# Patient Record
Sex: Female | Born: 1985 | Race: White | Hispanic: No | Marital: Single | Smoking: Current every day smoker
Health system: Southern US, Community
[De-identification: ages and names within clinical notes are randomized; demographics above are authoritative.]

---

## 2008-12-13 ENCOUNTER — Emergency Department: Payer: Self-pay | Admitting: Emergency Medicine

## 2010-01-01 ENCOUNTER — Ambulatory Visit: Payer: Self-pay | Admitting: Family Medicine

## 2010-01-07 ENCOUNTER — Encounter: Payer: Self-pay | Admitting: Family Medicine

## 2010-01-09 ENCOUNTER — Encounter: Payer: Self-pay | Admitting: Family Medicine

## 2010-01-10 ENCOUNTER — Emergency Department: Payer: Self-pay | Admitting: Emergency Medicine

## 2010-02-08 ENCOUNTER — Encounter: Payer: Self-pay | Admitting: Family Medicine

## 2010-02-25 ENCOUNTER — Encounter: Payer: Self-pay | Admitting: Obstetrics & Gynecology

## 2010-03-28 ENCOUNTER — Encounter: Payer: Self-pay | Admitting: Obstetrics and Gynecology

## 2010-05-05 ENCOUNTER — Inpatient Hospital Stay: Payer: Self-pay

## 2011-10-24 IMAGING — US US OB US >=[ID] SNGL FETUS
1 series · 17 of 28 positions shown · non-contrast
Comparison: none

REASON FOR EXAM: unsure dates
COMMENTS:

[Series 1: us ob us >=(id) sngl fetus · 17 of 87 slices shown]
[im 1/87]
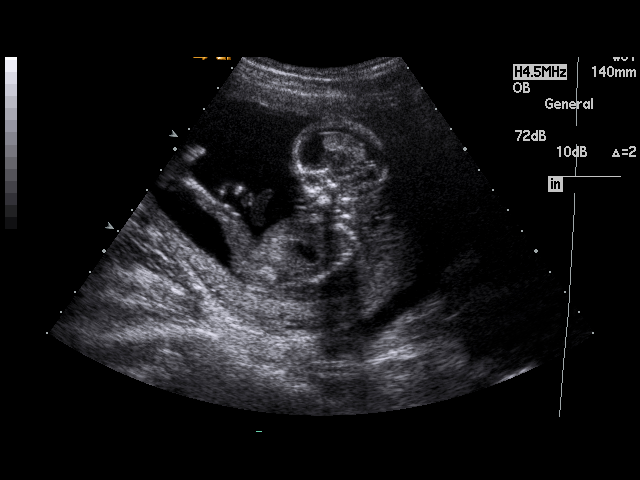
[im 7/87]
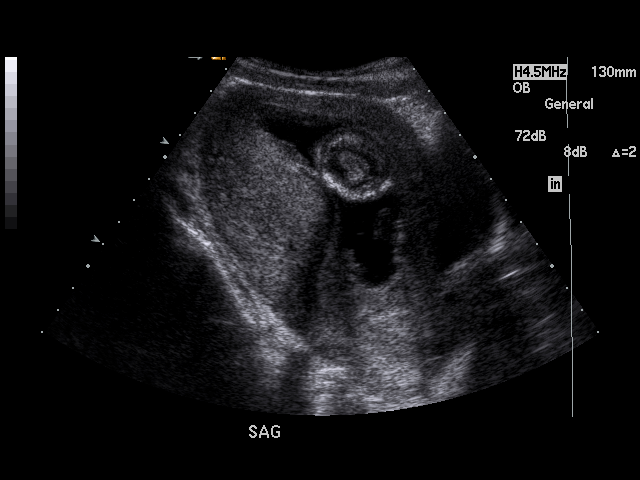
[im 13/87]
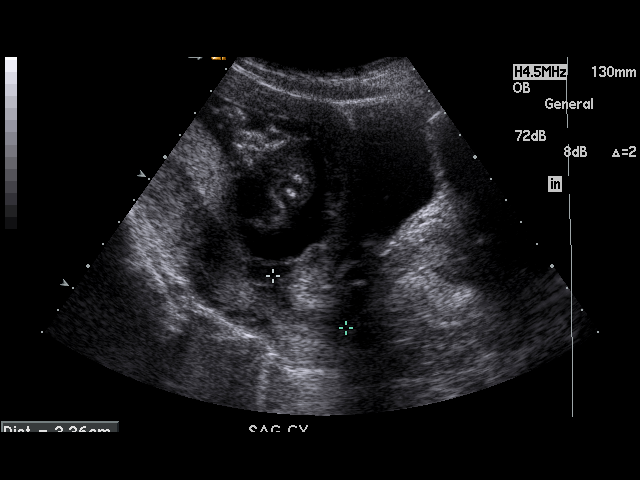
[im 16/87]
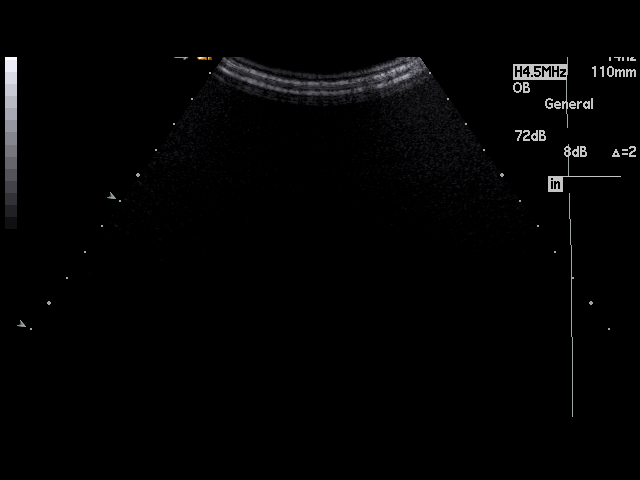
[im 23/87]
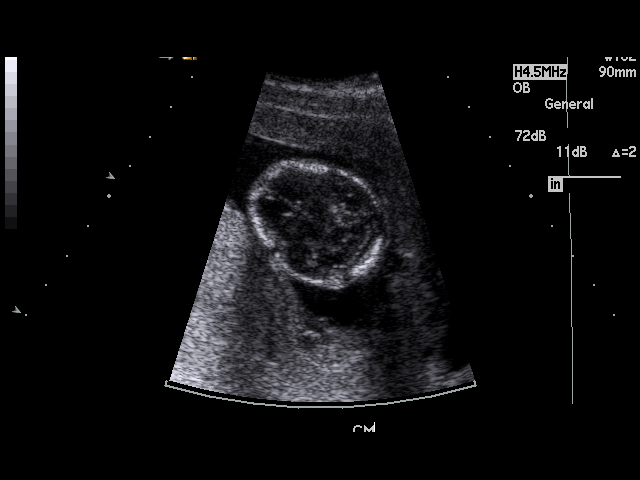
[im 29/87]
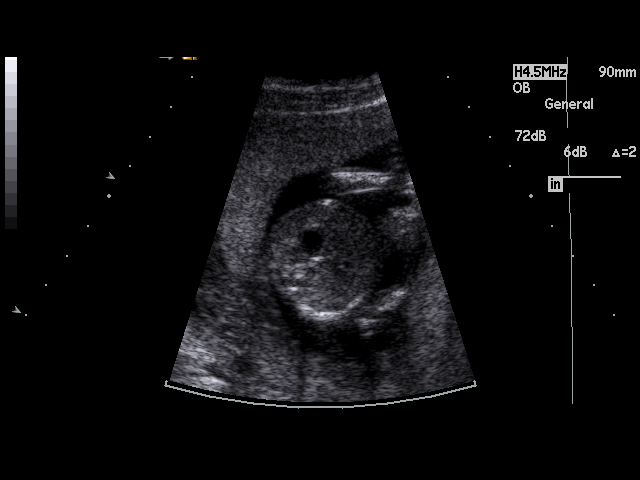
[im 32/87]
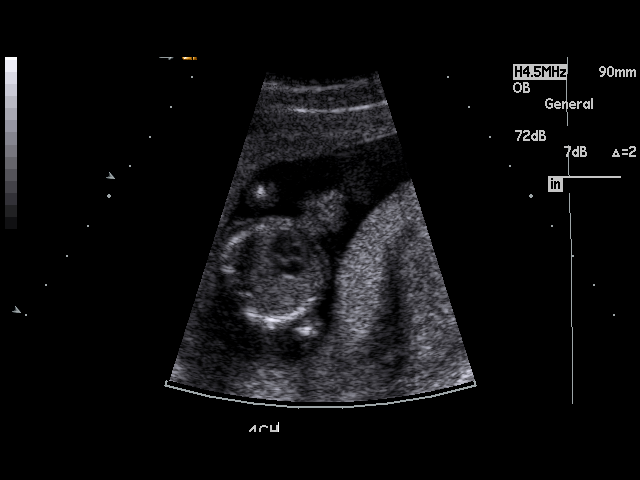
[im 39/87]
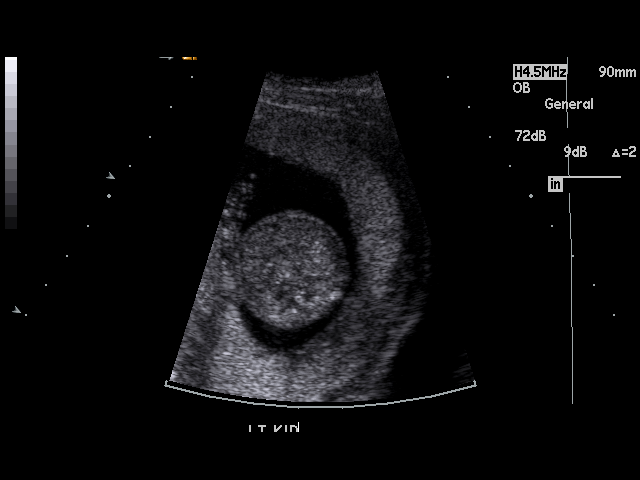
[im 45/87]
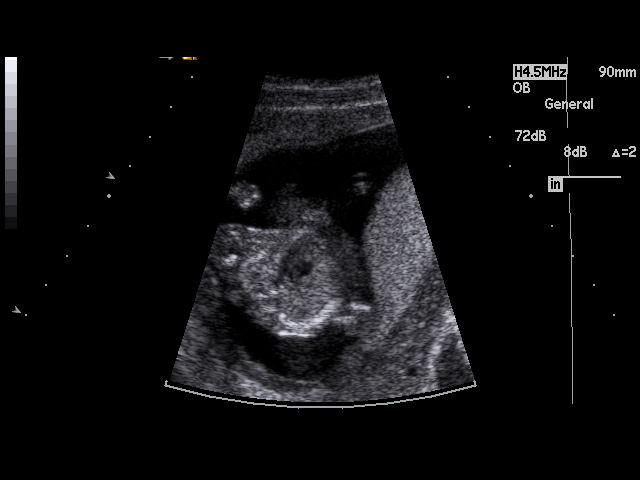
[im 48/87]
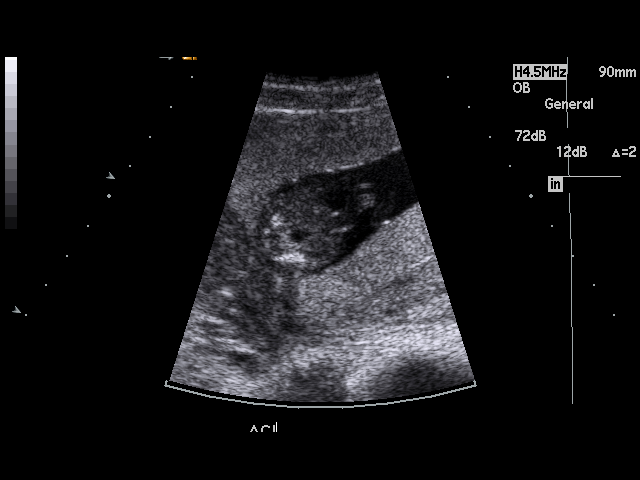
[im 55/87]
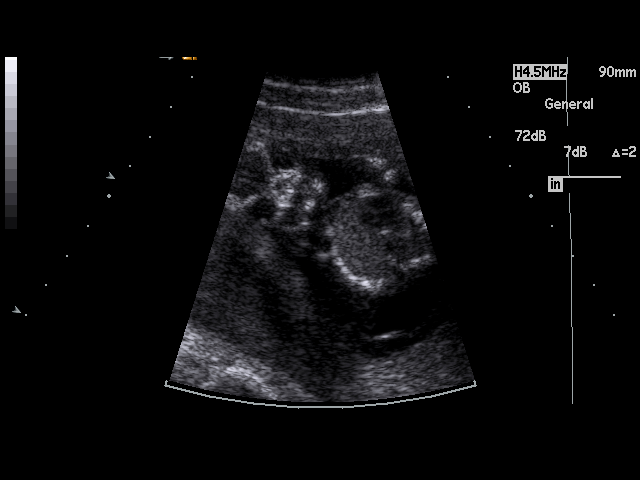
[im 58/87]
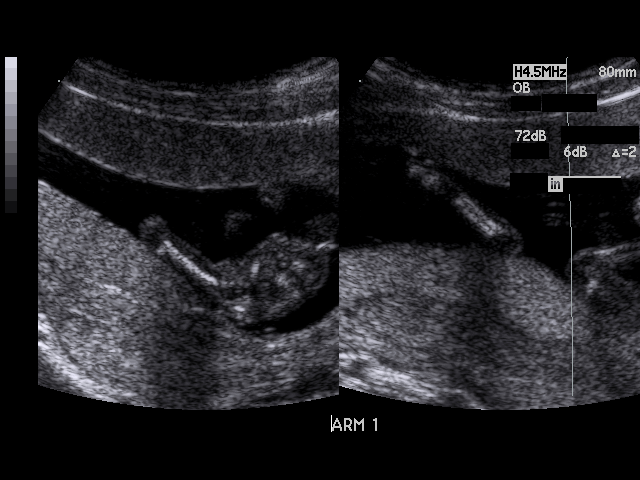
[im 64/87]
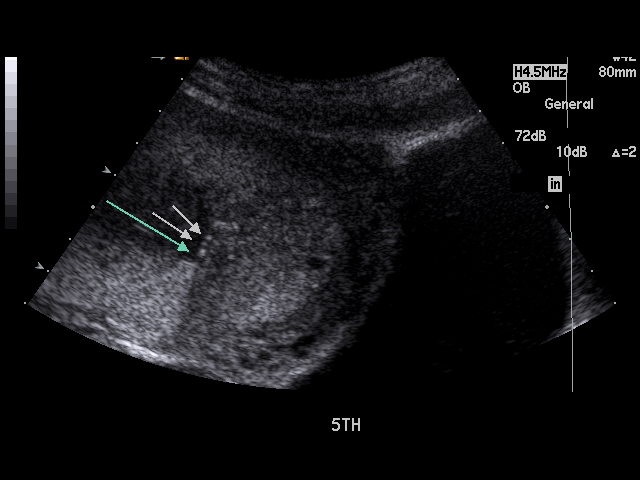
[im 71/87]
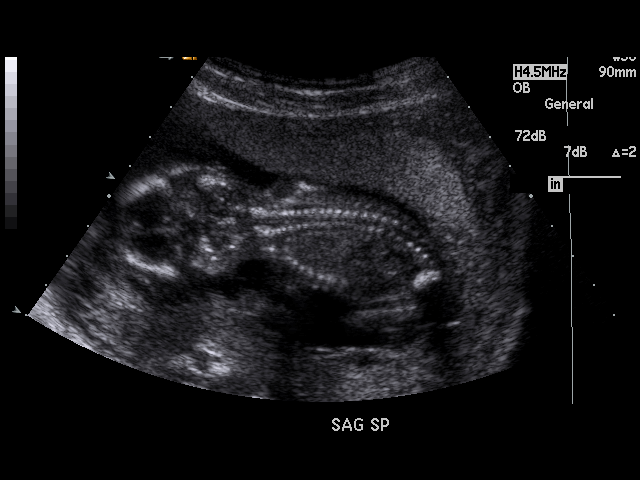
[im 74/87]
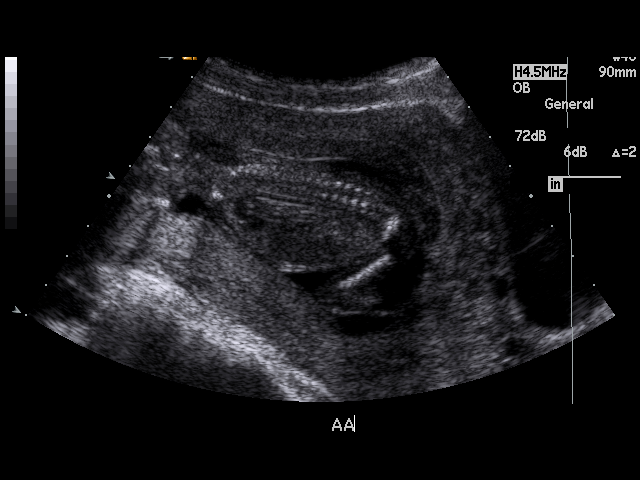
[im 80/87]
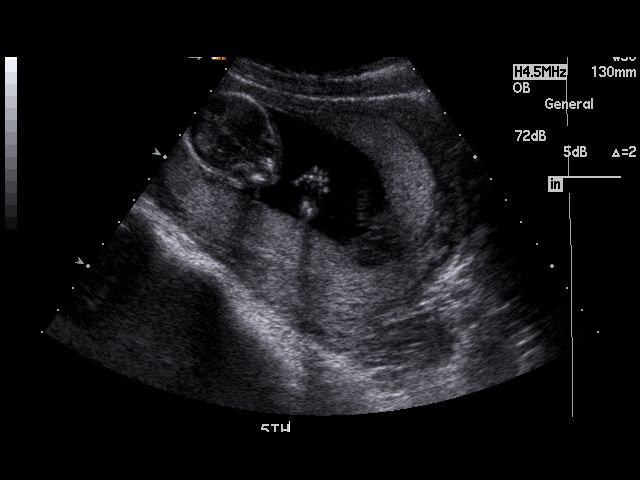
[im 87/87]
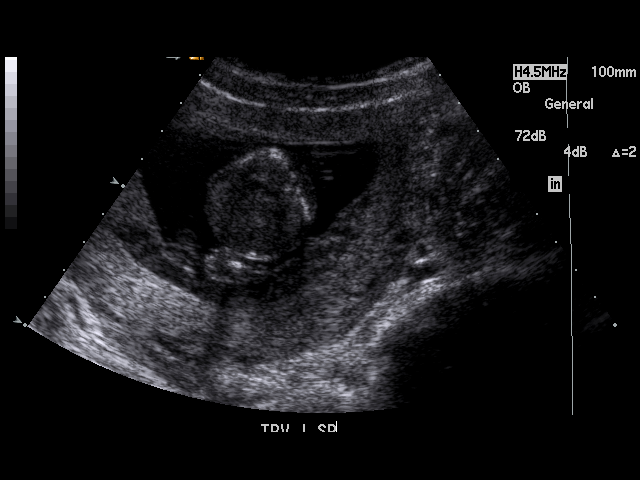

[17 of 28 positions shown; findings below may reference images not displayed]

PROCEDURE:     US  - US OB GREATER/OR EQUAL TO HWD0F  - January 01, 2010  [DATE]

RESULT:     There is a viable IUP. The presentation is variable. The
amniotic fluid volume is estimated to be normal. The placenta is posterior
and low lying with the inferior margin just over 2 cm from the internal os.
The intracranial structures and spinal structures are grossly normal for
this early gestational age. The fetal stomach, kidneys, urinary bladder, and
4 chambered heart were demonstrated. A cardiac rate of 141 beats per minute
was demonstrated.

Measured parameters:
BPD 3.04 cm corresponding to an EGA of 15 weeks 4 days
HC 11.73 cm corresponding to an EGA of 15 weeks 5 days
AC 10.24 cm corresponding to an EGA of 16 weeks 2 days
FL 1.75 cm corresponding to an EGA of 15 weeks one day
Estimated fetal weight 155 grams + / - 21 grams.
IMPRESSION: There is a viable IUP with variable presentation and
somewhat low lying placenta. The estimated gestational age is 15 weeks 5
days plus or minus approximately 10 days. The estimated date of confinement
is 20 June, 2010. No fetal anomalies were identified.

## 2012-01-18 IMAGING — US US OB FOLLOW-UP - NRPT [facility]
1 series · 14 of 28 positions shown · non-contrast
Comparison: none

[Series 1: us ob follow-up - nrpt [facility] · 14 of 59 slices shown]
[im 3/59]
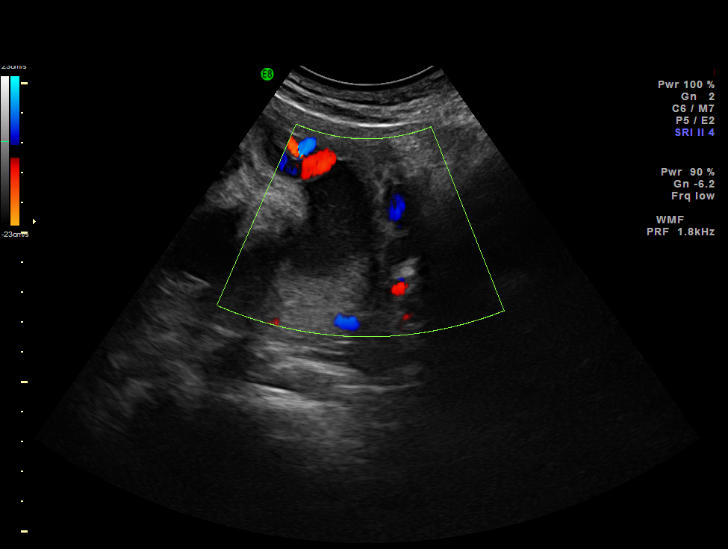
[im 7/59]
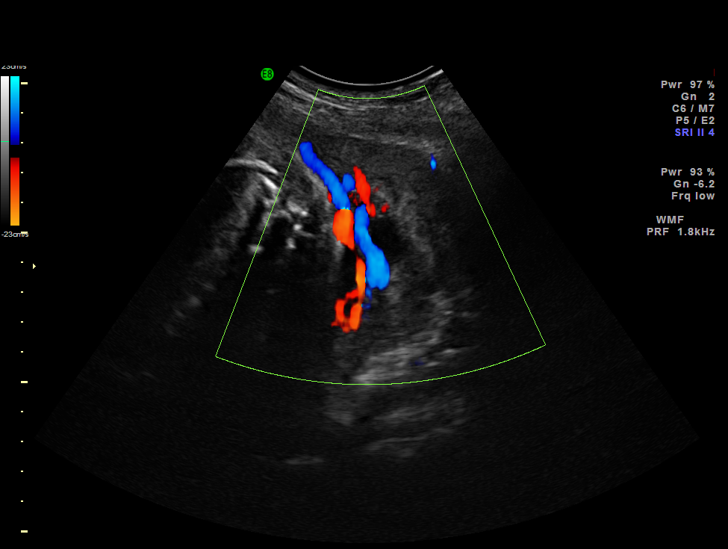
[im 11/59]
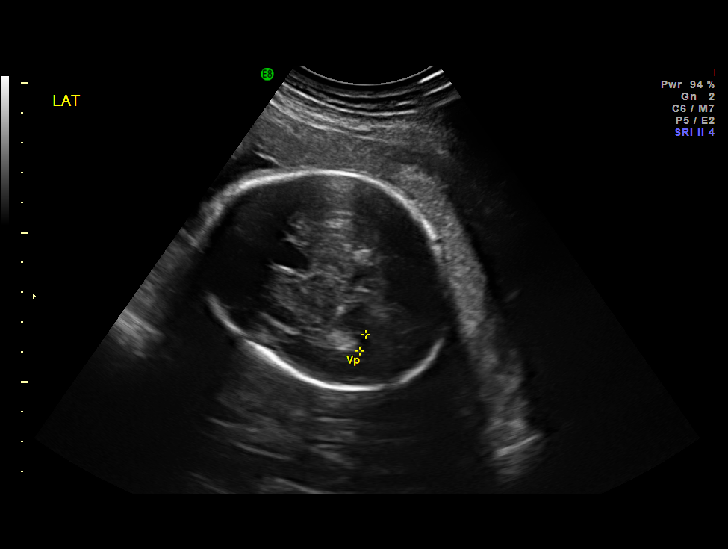
[im 16/59]
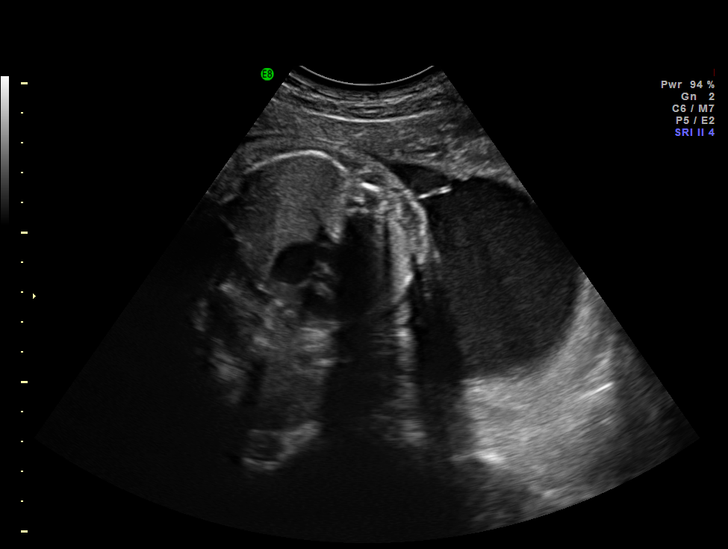
[im 20/59]
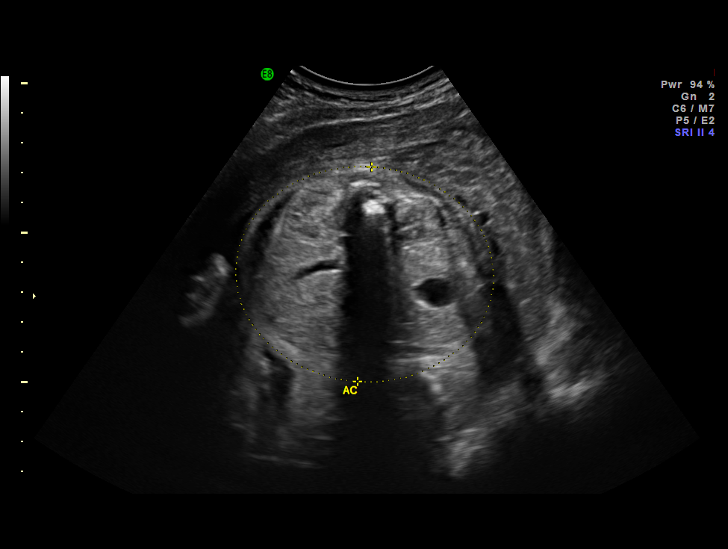
[im 24/59]
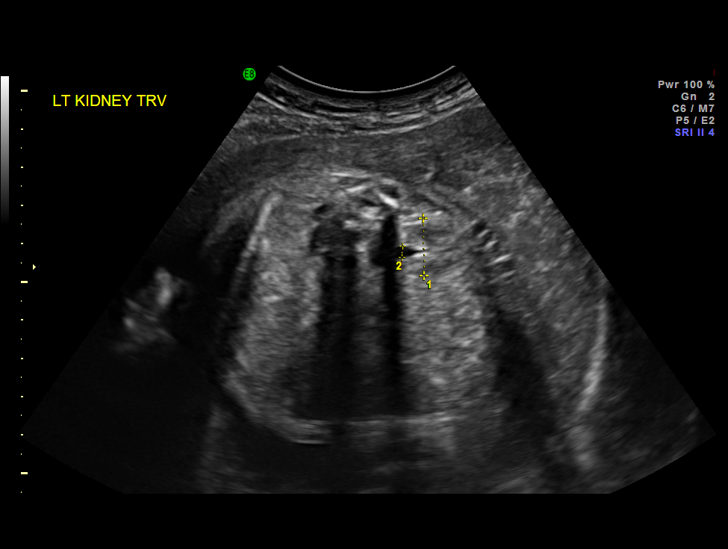
[im 28/59]
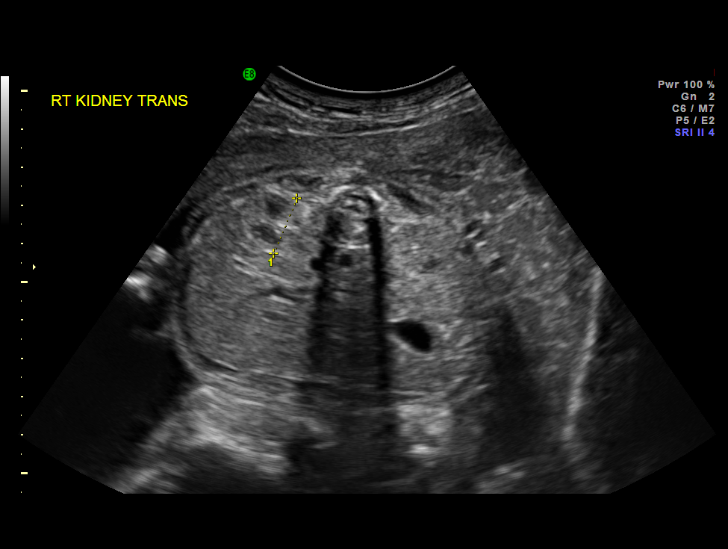
[im 33/59]
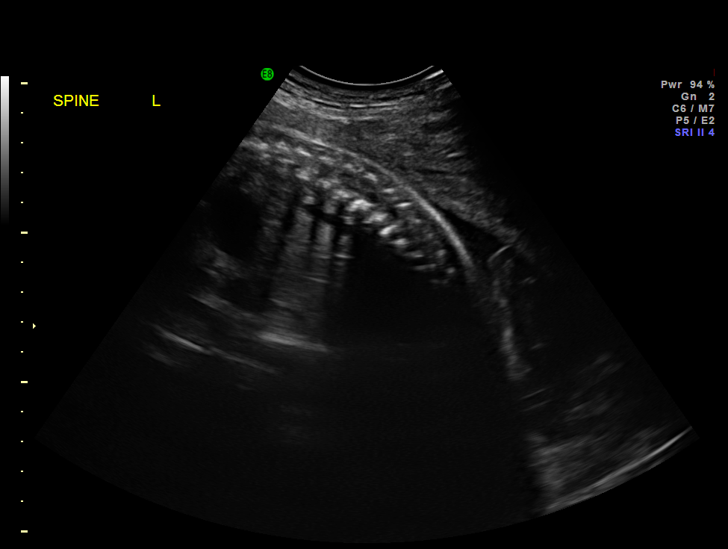
[im 37/59]
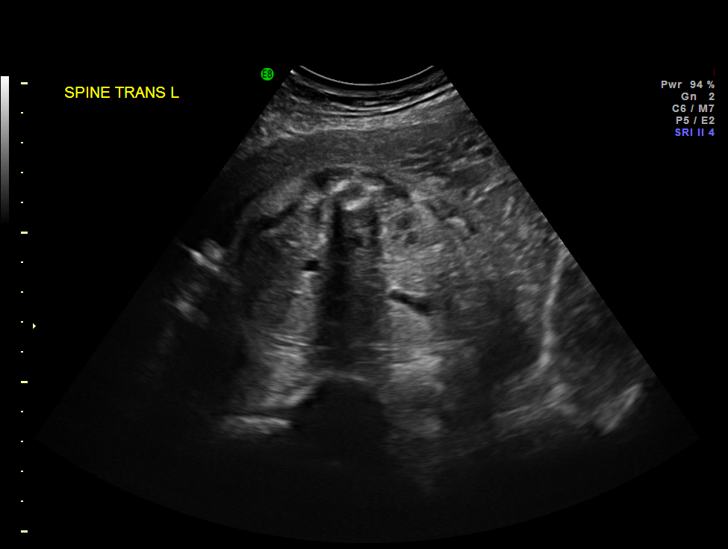
[im 41/59]
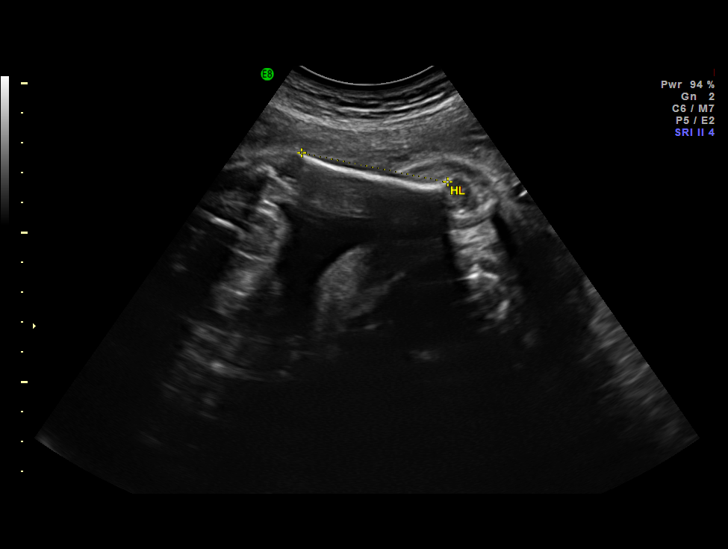
[im 46/59]
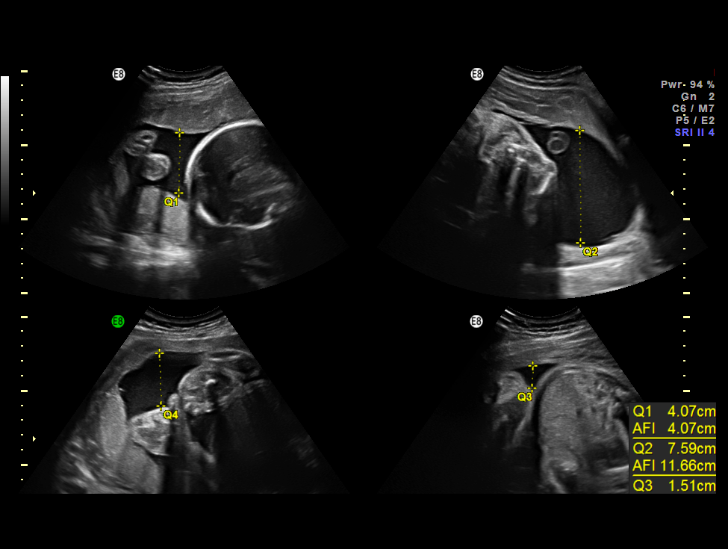
[im 50/59]
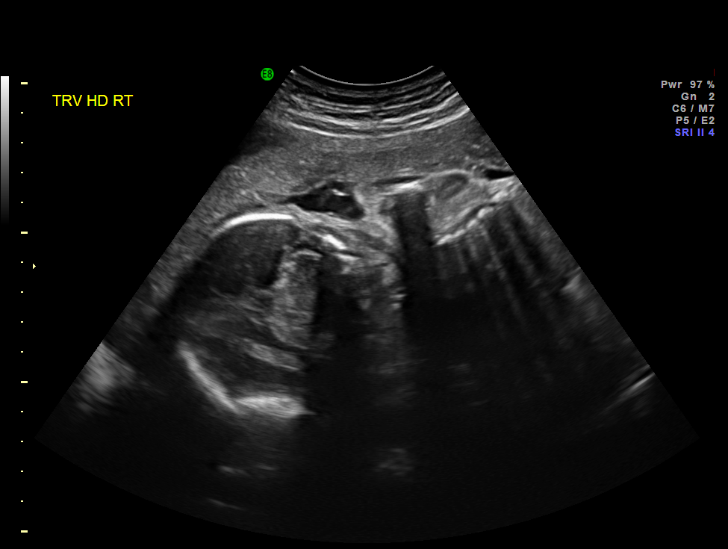
[im 54/59]
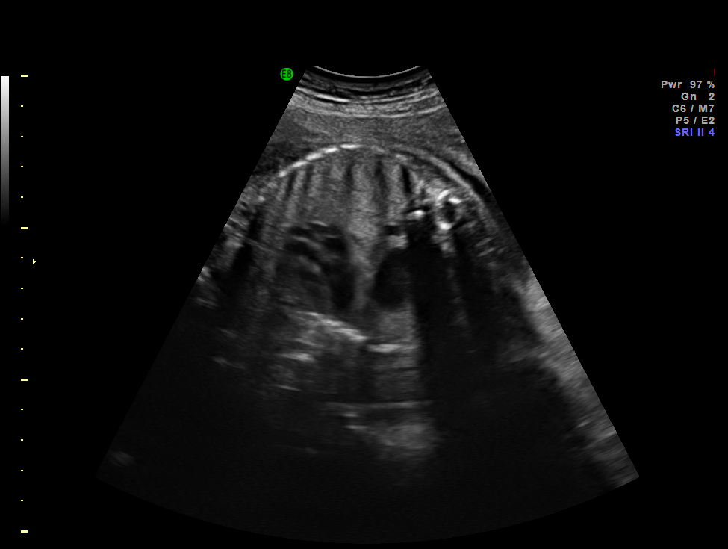
[im 59/59]
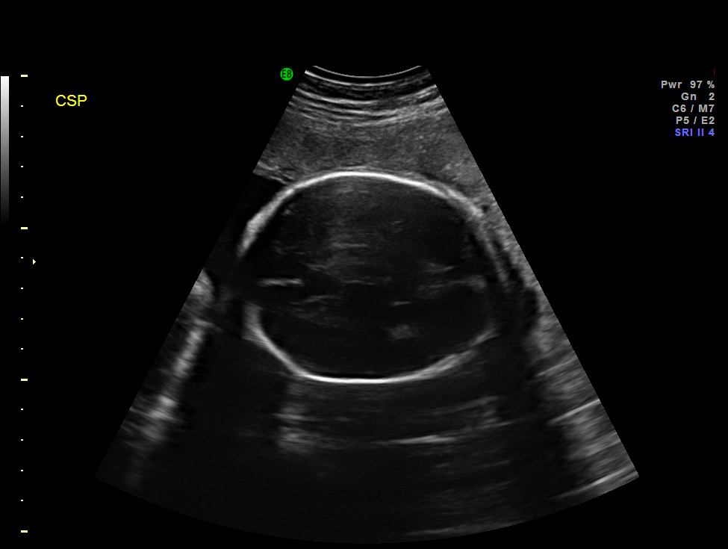

[14 of 28 positions shown; findings below may reference images not displayed]

IMAGES IMPORTED FROM THE [employer] WORKFLOW SYSTEM
NO DICTATION FOR STUDY

## 2012-12-07 ENCOUNTER — Emergency Department: Payer: Self-pay | Admitting: Emergency Medicine

## 2013-02-21 ENCOUNTER — Emergency Department: Payer: Self-pay | Admitting: Unknown Physician Specialty

## 2013-02-21 LAB — URINALYSIS, COMPLETE
Bilirubin,UR: NEGATIVE
Ketone: NEGATIVE
Nitrite: NEGATIVE
Ph: 5 (ref 4.5–8.0)
Specific Gravity: 1.024 (ref 1.003–1.030)

## 2013-02-21 LAB — CBC
HCT: 38.2 % (ref 35.0–47.0)
MCHC: 34.4 g/dL (ref 32.0–36.0)
MCV: 93 fL (ref 80–100)
Platelet: 232 10*3/uL (ref 150–440)
RDW: 13.4 % (ref 11.5–14.5)

## 2013-07-10 ENCOUNTER — Inpatient Hospital Stay: Payer: Self-pay | Admitting: Obstetrics and Gynecology

## 2013-07-10 DIAGNOSIS — O321XX0 Maternal care for breech presentation, not applicable or unspecified: Secondary | ICD-10-CM

## 2013-07-10 LAB — CBC WITH DIFFERENTIAL/PLATELET
Basophil #: 0.1 10*3/uL (ref 0.0–0.1)
Eosinophil #: 0.2 10*3/uL (ref 0.0–0.7)
Eosinophil %: 1.1 %
Lymphocyte %: 16 %
MCHC: 34.7 g/dL (ref 32.0–36.0)
MCV: 89 fL (ref 80–100)
Monocyte #: 0.9 x10 3/mm (ref 0.2–0.9)
Neutrophil #: 11.7 10*3/uL — ABNORMAL HIGH (ref 1.4–6.5)
Neutrophil %: 76.4 %
Platelet: 244 10*3/uL (ref 150–440)
RDW: 13.4 % (ref 11.5–14.5)
WBC: 15.3 10*3/uL — ABNORMAL HIGH (ref 3.6–11.0)

## 2014-08-24 ENCOUNTER — Ambulatory Visit: Payer: Self-pay | Admitting: Obstetrics & Gynecology

## 2014-10-05 ENCOUNTER — Inpatient Hospital Stay: Payer: Self-pay

## 2014-12-01 NOTE — Op Note (Signed)
PATIENT NAME:  [name] Odonnell, [name] Odonnell MR#:  [id number] DATE OF BIRTH:  18-Jan-1986  DATE OF PROCEDURE:  07/10/2013  PREOPERATIVE DIAGNOSES:  11.  A 29 year old, G2, P0-1-0-1 at 35 weeks, 6 days gestation.  2.  Preterm labor presenting at cervical dilation of 5 cm.  3.  Possible abruption with heavy vaginal bleeding at admission and nonreassuring fetal surveillance with decreased to absent variability noted on the fetal heart tones.  4.  Breech presentation.   POSTOPERATIVE DIAGNOSES: 31.  A 29 year old, G2, P0-1-0-1 at 35 weeks, 6 days gestation.  2.  Preterm labor presenting at cervical dilation of 5 cm.  3.  Possible abruption with heavy vaginal bleeding at admission and nonreassuring fetal surveillance with decreased to absent variability noted on the fetal heart tones.  4.  Breech presentation.   OPERATION PERFORMED: Primary low transverse cesarean section via Pfannenstiel skin incision.   ANESTHESIA USED: Spinal.   PRIMARY SURGEON: Lorrene Reid M.D.   ESTIMATED BLOOD LOSS: 600 mL.  OPERATIVE FLUIDS: 800 mL.   URINE OUTPUT: 150 mL of clear urine.  PREOPERATIVE ANTIBIOTICS: 1 gram of Ancef.   DRAINS OR TUBES: Foley to gravity drainage and On-Q catheter system.   IMPLANTS: None.   COMPLICATIONS: None.   INTRAOPERATIVE FINDINGS: Normal tubes, ovaries, and uterus. Delivery resulted in the birth of a live born female infant weighing 5 pounds 3 ounces, 2360 grams, Apgars 6 and 9.   SPECIMENS REMOVED: Placenta. Please note, cord gas was also sent, which returned with a pH of 7.31, CO2 of 57, bicarbonate 28.7 and base excess 1.2   PATIENT CONDITION FOLLOWING THE PROCEDURE: Stable.   PROCEDURE IN DETAIL: Risks, benefits, and alternatives of the procedure, in light of the physical exam and fetal heart tone findings, were discussed with the patient prior to proceeding to the case. The patient was taken to the operating room where she was placed under spinal anesthesia. She was prepped and  draped in the usual sterile fashion and the level of anesthetic was checked prior to proceeding with the case. A Pfannenstiel skin incision was made 2 cm above the pubic symphysis, carried down sharply to the level of the rectus fascia using a scalpel. The fascia was incised in the midline using the scalpel then extended using Mayo scissors. The superior border of the rectus fascia was grasped with 2 Kocher clamps. The underlying rectus muscles were dissected off the fascia using blunt dissection and the median raphe was incised using Mayo scissors. The inferior border of the rectus fascia was dissected off the rectus muscles in a similar fashion. The midline was identified. The peritoneum was entered bluntly. The peritoneal incision was extended using manual traction. A bladder blade was placed. A low transverse hysterotomy incision was then made by scoring the uterus with the scalpel. The uterus was entered bluntly using the operator's finger and the hysterotomy incision extended using manual traction. Upon placing the operator's hand into the hysterotomy incision, the fetus was noted to be in the frank breech position. The feet were grasped, brought to the incision, the infant was then delivered to the level of the scapula. The right arm was splinted and delivered across the patient's chest. The infant was then rotated 180 degrees and the left arm was splinted and delivered in a similar fashion. The fetal head was then flexed using a mauriceau smellie white maneuver and the head delivered using fundal pressure.   The cord was cut and the infant was passed to the awaiting pediatricians.  Cord gas and cord blood were obtained. The placenta was delivered using manual extraction. The uterus was exteriorized and wiped clean of clots and debris and closed using a 2 layer closure of 0 Vicryl with the first being a running locked, the second a horizontal imbricating. The uterus was returned to the abdomen, inspected and  noted to be hemostatic except for a small area on the left aspect of the hysterotomy, which was oversewn with single figure-of-eight, which achieved adequate hemostasis. The peritoneal gutters were wiped clean of clots and debris using 2 moist laps. The rectus muscles were reapproximated in the midline using a 2-0 Vicryl mattress stitch. The On-Q catheters were then placed subfascially per the usual protocol before closing the fascia using a single looped #1 PDS in a running fashion. The subcutaneous tissue was irrigated and hemostasis achieved using the Bovie. The skin was closed using a 4-0 Monocryl in subcuticular fashion. Each On-Q catheter was then bolused with 5 mL of 0.5% bupivacaine each. Sponge, needle, and instrument counts were correct x 2. The patient tolerated the procedure well and was taken to the recovery room in stable condition.   ____________________________ Florina OuAndreas M. Bonney AidStaebler, MD ams:aw D: 07/11/2013 08:54:42 ET T: 07/11/2013 09:17:07 ET JOB#: [id number]  cc: Florina OuAndreas M. Bonney AidStaebler, MD, <Dictator> Carmel SacramentoANDREAS Cathrine MusterM Sarit Sparano MD ELECTRONICALLY SIGNED 07/31/2013 19:01

## 2014-12-10 NOTE — Op Note (Signed)
PATIENT NAME:  [name] Odonnell, [name] Odonnell MR#:  [id number] DATE OF BIRTH:  Jun 14, 1986  DATE OF PROCEDURE:  10/05/2014  PREOPERATIVE DIAGNOSES: 5.  A 29 year old G3, P0-2-0-2 at 37 weeks 3 days presenting in term labor with cervical dilation of 5 cm.  2.  History of prior low transverse Cesarean section, desiring repeat.   POSTOPERATIVE DIAGNOSES:  89.  A 29 year old G3, P0-2-0-2 at 37 weeks 3 days presenting in term labor with cervical dilation of 5 cm.  2.  History of prior low transverse Cesarean section, desiring repeat.   PROCEDURE PERFORMED: Repeat low transverse cesarean section via Pfannenstiel skin incision.   ANESTHESIA USED: Spinal.   PRIMARY SURGEON: Vena Austria, MD   ESTIMATED BLOOD LOSS: 600 mL.  OPERATIVE FLUIDS: 500 mL.   URINE OUTPUT: 40 mL.   COMPLICATIONS: None.   INTRAOPERATIVE FINDINGS: Normal anatomy. Minimal scarring of the rectus fascia was encountered. The abdomen was clear of adhesions. Delivery resulted in the birth of a liveborn female infant weighing 3140 grams (6 pounds, 15 ounces) with Apgars 8 and 9.   SPECIMENS REMOVED: None.   PATIENT CONDITION FOLLOWING PROCEDURE: Stable.   PROCEDURE IN DETAIL: Risks, benefits, and alternatives of the procedure were discussed with the patient prior to proceeding to the operating room. The patient was taken to the operating room where she was placed under spinal anesthesia. She was positioned in the supine position, prepped and draped in the usual sterile fashion. Timeout was performed. Attention was turned to the patient's abdomen. A Pfannenstiel incision was scored 2 cm above the pubic symphysis utilizing the patient's pre-existing scar. The incision was carried down sharply to the level of the rectus fascia. The fascia was incised in the midline and extended using Mayo scissors. The superior border of the rectus fascia was grasped with 2 Kocher clamps. The underlying rectus muscles were dissected off rectus fascia bluntly. The  inferior border of the rectus fascia was likewise grasped with 2 Kocher clamps and the rectus fascia was dissected off the muscle in a similar fashion. The median raphe was incised using Mayo scissors. The midline was identified, entered bluntly. The peritoneal incision was extended using manual traction. A bladder blade was placed. A low transverse incision was scored on the uterus. The hysterotomy was entered bluntly using the operator's finger. The infant was noted to be in the OA position. Amniotomy was performed, noting clear fluid. The vertex was grasped, flexed, brought to the incision, and delivered atraumatically using fundal pressure. The remainder of the body delivered with ease. The infant was suctioned, cord was clamped and cut, and the infant was passed to the awaiting pediatricians. The placenta was delivered using manual extraction. The uterus was exteriorized and wiped clean of clots and debris. The uterus was repaired using a 2 layer closure of 0 Vicryl, with the first being a running lock and the second one imbricating. The uterus was returned to the abdomen. Hysterotomy incision was reinspected and noted to be hemostatic. The peritoneal gutters were wiped clean of clots and debris. The rectus muscles were reapproximated in the midline using a 2-0 Vicryl mattress stitch. The On-Q catheters were placed subfascial with the usual protocol. The fascia was closed using a running looped PDS. The subcutaneous tissue was irrigated and hemostasis achieved using the Bovie. Skin was closed using staples. Sponge, needle and instrument counts were correct x2. The patient tolerated the procedure well and was taken to the recovery room in stable condition.  ____________________________ Florina Ou. Bonney Aid, MD ams:sb  D: 10/09/2014 07:57:13 ET T: 10/09/2014 08:12:34 ET JOB#: [id number]  cc: Florina OuAndreas M. Bonney AidStaebler, MD, <Dictator> Lorrene ReidANDREAS M Cheryll Keisler MD ELECTRONICALLY SIGNED 10/17/2014 21:02

## 2014-12-14 IMAGING — US US OB LIMITED
1 series · 14 of 28 positions shown · non-contrast
Comparison: none

REASON FOR EXAM: vaginal bleeding
COMMENTS:

PROCEDURE:     US  - US LIMITED OB  - February 21, 2013  [DATE]
RESULT:     Comparison: None.
TECHNIQUE: Multiple grayscale and color Doppler images were obtained of the
pelvis.

[Series 1: us ob limited · 0.26mm/px · 14 of 48 slices shown]
[im 2/48]
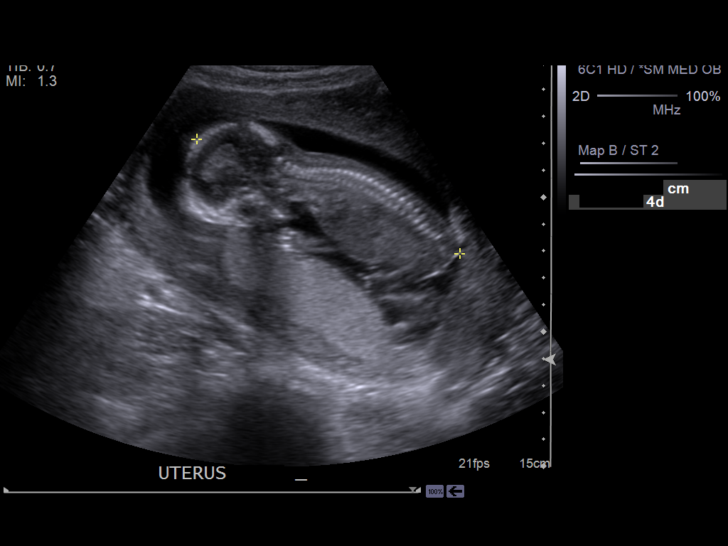
[im 6/48]
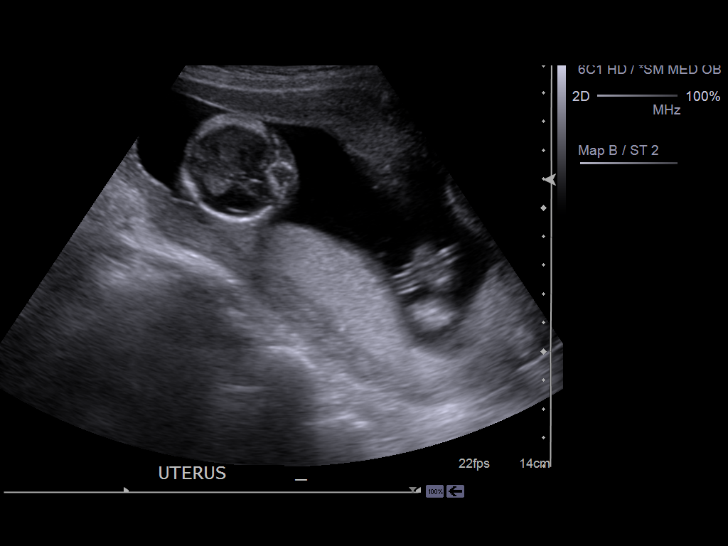
[im 9/48]
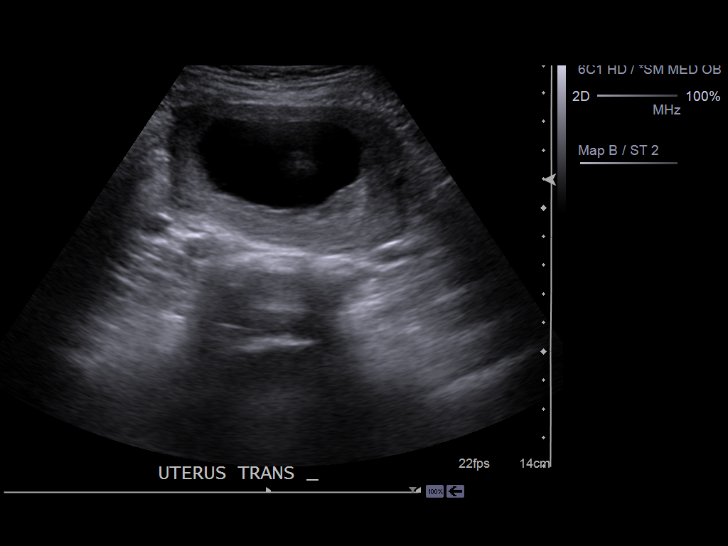
[im 13/48]
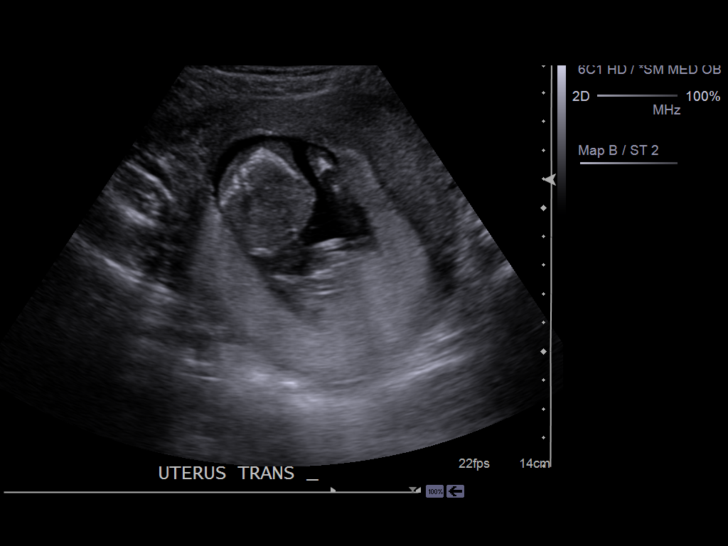
[im 16/48]
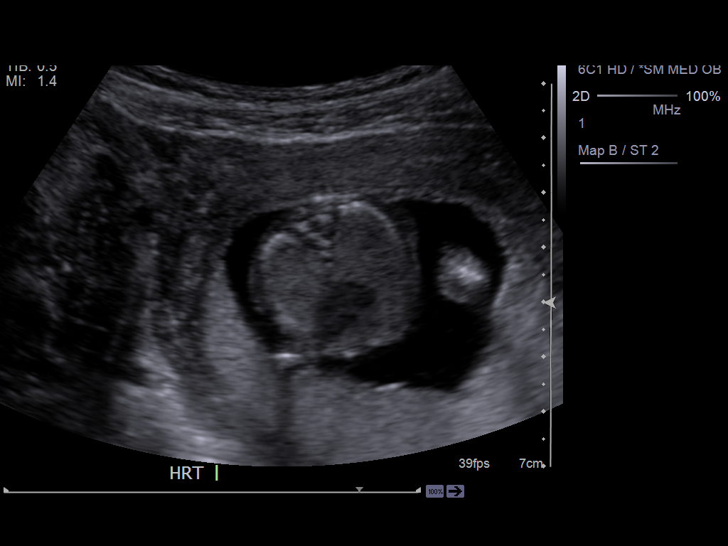
[im 20/48]
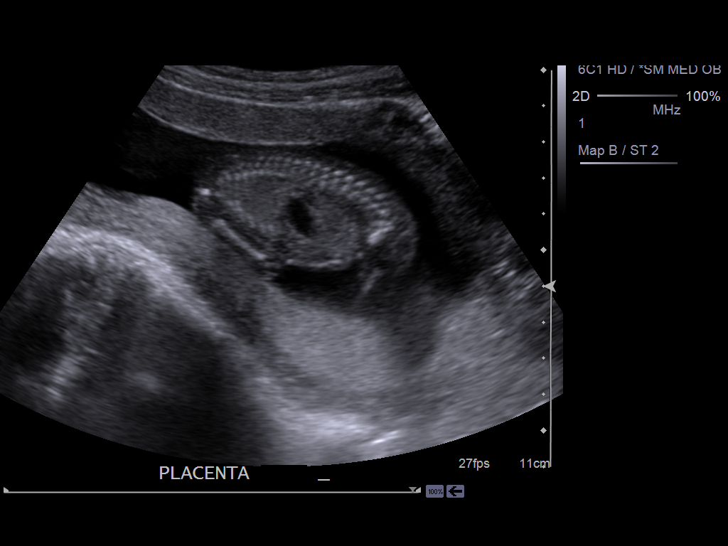
[im 23/48]
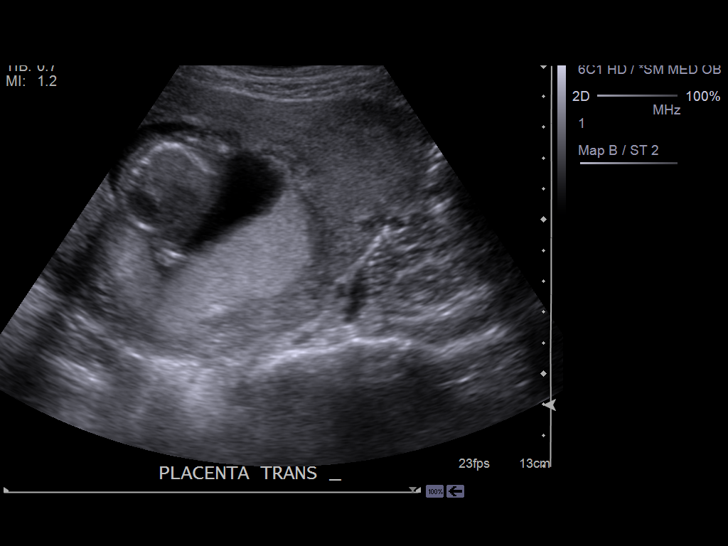
[im 27/48]
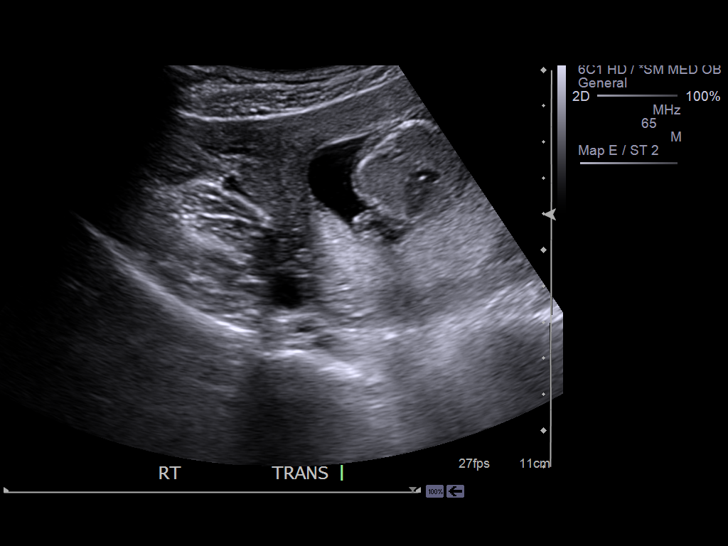
[im 30/48]
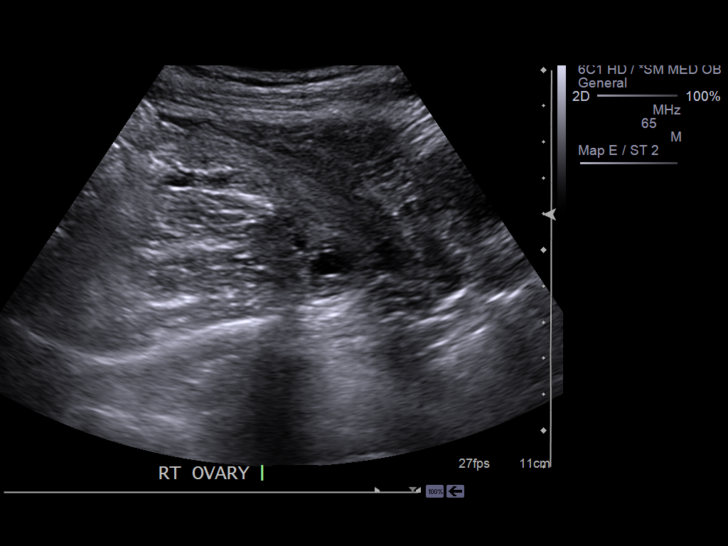
[im 34/48]
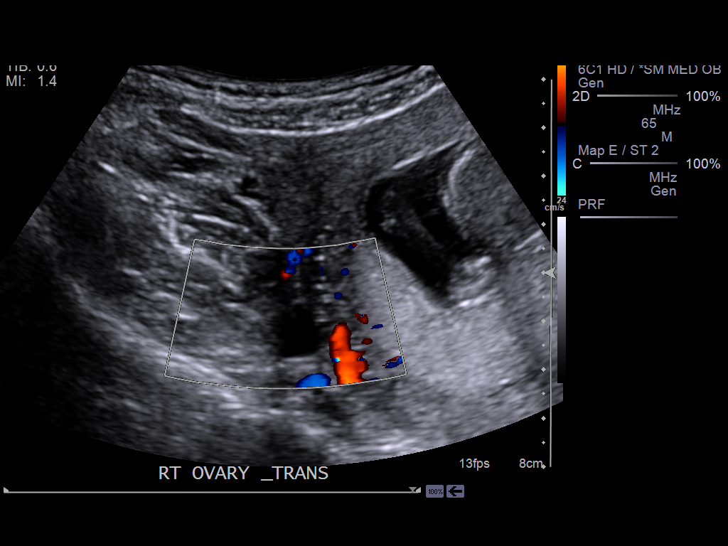
[im 37/48]
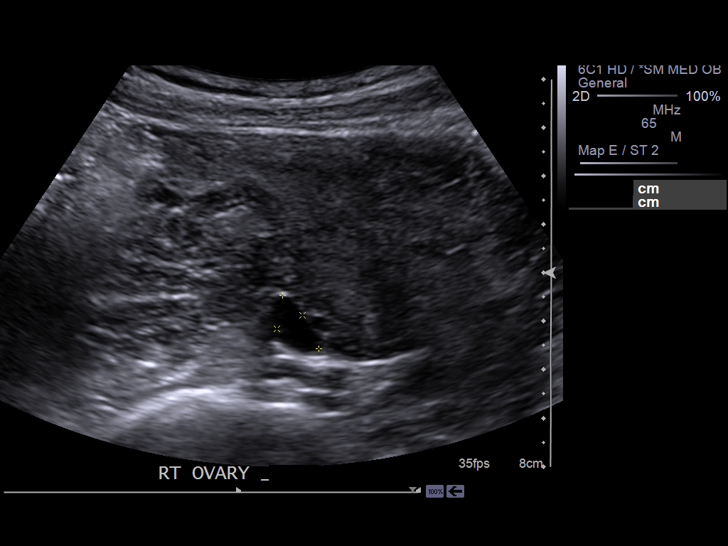
[im 41/48]
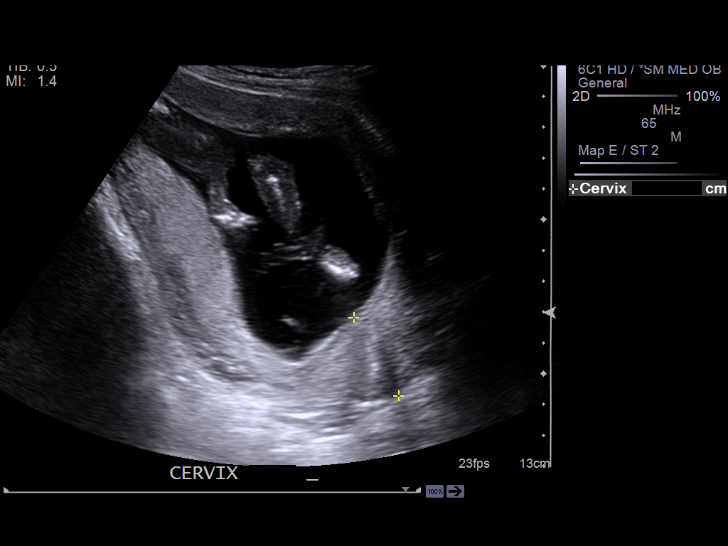
[im 44/48]
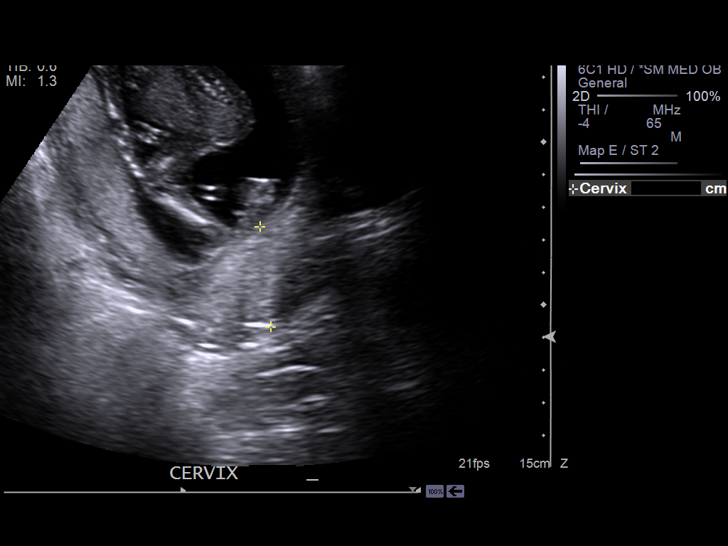
[im 48/48]
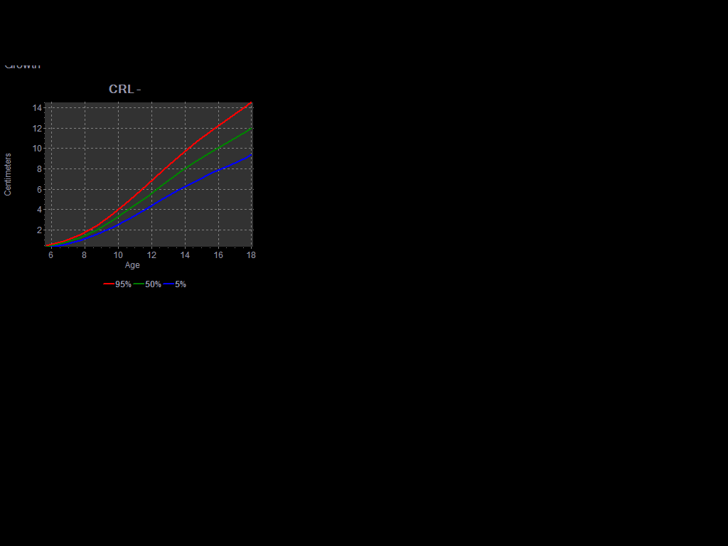

[14 of 28 positions shown; findings below may reference images not displayed]

FINDINGS: There is a single live intrauterine pregnancy. The crown-rump length
measures 10.7 cm, which correlate with estimated gestational age of 16 weeks
4 days. The fetal heart rate was 132 beats per minute. The fetus is
currently in breech position. The cervix measures 2.9 cm in length and is
closed. The placenta is in a posterior position. No evidence of placenta
previa.

The right ovary measures 2.2 x 1.6 x 1.4 cm. Color Doppler flow is
associated with the right ovary. The left ovary was not visualized.

Please note, this is a limited OB ultrasound and not a detailed fetal
anatomic survey.
IMPRESSION: 1. Single live intrauterine pregnancy. No acute findings.
2. Please note, this is not a detailed fetal anatomic survey. Followup fetal
ultrasound could be performed.

[REDACTED]

## 2014-12-19 NOTE — H&P (Signed)
L&D Evaluation:  History:  HPI 29 yo G2P0101 [facility]661[facility]d [facility] presenting with vaginal bleeding that started 30min prior to presentation.  Also intermitten abdominal tightening, no abdominal pain, no concern for low lying placenta or previa on prior scans.  +FM, no LOF.  [facility] secondary to prior 33 week delivery, was receiving 17-P injections this pregnancy.   Presents with vaginal bleeding   Patient's Medical History No Chronic Illness   Patient's Surgical History migraines   Medications Pre Natal Vitamins  17-p injections weekly   Allergies Codeine   Social History none   Family History Non-Contributory   ROS:  ROS All systems were reviewed.  HEENT, CNS, GI, GU, Respiratory, CV, Renal and Musculoskeletal systems were found to be normal.   Exam:  Vital Signs stable   Urine Protein not completed   General no apparent distress   Mental Status clear   Chest no increased work of breathing   Abdomen gravid, non-tender   Estimated Fetal Weight Small for gestational age   Fetal Position breech on bedside US   Edema no edema   Pelvic no external lesions, 5cm per nursing staff   Mebranes Intact, some visible bleeding on perineum   FHT 120, minimal variability, no accels, no decels   Ucx regular, 5min   Impression:  Impression 29 yo G2P0101 [facility] 5961[facility]d [facility] presenting in preterm labor, breech presentation, with vaginal bleeding and minimal variability   Plan:  Plan EFM/NST   Comments 1) PTL - advance cervical dilation in setting of breech presentation, non-reassuring fetal surveillance proceed with 1LTCS for delvery  2) Fetus - category III tracing  3) PNL A pos / RI / VZI / HBsAG neg / HIV neg / RPR NR / 1-hr 110 / GBS unknown   4) Immunizations - received TDAP on 06/06/13  5) Disposition - pending C-section delivery   Follow Up Appointment need to schedule   LTCS Consent: I have had an  exceedingly long and careful discussion with this patient about her circumstances and options available. She understands that I have carefully evaluated the infant's fetal heart rate pattern, the probable time remaining in her labor and her reserve. We are at a point where one of them will need to take a potential risk, either she take the risk of a cesarean section or the infant take a risk that the fetal intolerance to labor is very significant with the potential for a real effect on the baby which will worsen if we allow labor to continue.  She also understands that interpretation of the FHT is not a precise science and that someone else might allow labor to continue for the present. Mutual decision made to proceed with abdominal surgery.  Fully informed consent obtained including the risks of anesthesia, hemorrhage, infection, injury to adjacent structures, bowel, bladder, and blood vessels..  Electronic Signatures: Lorrene ReidStaebler, Analee Montee M (MD)  (Signed [id number] 103230-Nov-14 22:10)  Authored: L&D Evaluation, Consent   Last Updated: 30-Nov-14 22:10 by Lorrene ReidStaebler, Darly Fails M (MD)

## 2014-12-19 NOTE — H&P (Signed)
L&D Evaluation:  History Expanded:  HPI 29 yo G3 P0202 with EDD of 10/23/14 per 31 wk US presents at 7813w3d with c/o back pain and bleeding[facility] to [facility] at 30 wks.  Prior PTB x 2, G1 vaginal delivery at 33 wks, G2 CS at 35 wks for previa and breech.   Blood Type (Maternal) A positive   Group B Strep Results Maternal (Result >5wks must be treated as unknown) unknown/result > 5 weeks ago   Maternal HIV Negative   Maternal Syphilis Ab Nonreactive   Maternal Varicella Immune   Rubella Results (Maternal) immune   Maternal T-Dap Immune   Patient's Surgical History Previous C-Section  ankle surgery   Medications Pre Natal Vitamins   Allergies Codeine   Social History none   Exam:  Vital Signs stable   General appears uncomfortable   Mental Status clear   Chest clear   Heart no murmur/gallop/rubs   Abdomen gravid, tender with contractions   Estimated Fetal Weight Average for gestational age   Pelvic no external lesions, 5/90/-1, BBOW   Mebranes Intact   FHT baseline 130, min variability, no accels noted yet   Ucx regular, q 2-4 min   Impression:  Impression IUP at 37 wks, active labor, h/o prior c-section desiring repeat   Plan:  Comments Pt offered TOLAC as she is already dilated. Pt declines, stating she desires repeat C-section.  Dr Bonney AidStaebler and OR/anesthesia notified about c-section. Will plan to proceed to cesarean delivery soon.   Electronic Signatures: [name] Odonnell, [name] Odonnell (CNM)  (Signed 25-Feb-16 17:25)  Authored: L&D Evaluation   Last Updated: 25-Feb-16 17:25 by Vella KohlerBrothers, Almalik Weissberg Odonnell (CNM)

## 2015-08-12 NOTE — L&D Delivery Note (Signed)
Delivery Summary for [name] Odonnell  Labor Events:   Preterm labor:   Rupture date:   Rupture time:   Rupture type:   Fluid Color:   Induction:   Augmentation:   Complications:   Cervical ripening:          Delivery:   Episiotomy:   Lacerations:   Repair suture:   Repair # of packets:   Blood loss (ml):    Information for the patient's newborn:  [name] Odonnell, [name] Odonnell [[id number]][[id number]]    Delivery 01/10/2016 6:20 AM by  C-Section, Low Vertical Sex:  female Gestational Age: <None> Delivery Clinician:  Hildred LaserAnika Rhianon Zabawa Living?: Yes        APGARS  One minute Five minutes Ten minutes  Skin color: 1   1      Heart rate: 2   2      Grimace: 2   2      Muscle tone: 2   2      Breathing: 2   2      Totals: 9  9      Presentation/position: Vertex     Resuscitation:   Cord information: 3 vessels   Disposition of cord blood:     Blood gases sent?  Complications:   Placenta: Delivered: 01/10/2016 6:21 AM  Manual removal   appearance Newborn Measurements: Weight: 6 lb 0.3 oz (2730 g)  Height: 19.29"  Head circumference:    Chest circumference:    Other providers: Registered Nurse Transition RN Kennith GainSarah C Apel Kerin PernaSamantha J Stephens  Additional  information: Forceps:   Vacuum:   Breech:   Observed anomalies         See Dr. Oretha Milchherry's operative note for details of C-section procedure.    Hildred LaserAnika Jammy Stlouis, MD [facility]

## 2016-01-10 ENCOUNTER — Inpatient Hospital Stay: Admitting: Anesthesiology

## 2016-01-10 ENCOUNTER — Inpatient Hospital Stay
Admission: EM | Admit: 2016-01-10 | Discharge: 2016-01-13 | DRG: 765 | Disposition: A | Discharge status: Home / Self Care | Attending: Obstetrics and Gynecology | Admitting: Obstetrics and Gynecology

## 2016-01-10 ENCOUNTER — Encounter
Admission: EM | Disposition: A | Discharge status: Home / Self Care | Payer: Self-pay | Source: Home / Self Care | Attending: Obstetrics and Gynecology

## 2016-01-10 DIAGNOSIS — Z30017 Encounter for initial prescription of implantable subdermal contraceptive: Secondary | ICD-10-CM

## 2016-01-10 DIAGNOSIS — R782 Finding of cocaine in blood: Secondary | ICD-10-CM | POA: Diagnosis present

## 2016-01-10 DIAGNOSIS — O34211 Maternal care for low transverse scar from previous cesarean delivery: Secondary | ICD-10-CM | POA: Diagnosis present

## 2016-01-10 DIAGNOSIS — Z3A34 34 weeks gestation of pregnancy: Secondary | ICD-10-CM

## 2016-01-10 DIAGNOSIS — Z3493 Encounter for supervision of normal pregnancy, unspecified, third trimester: Secondary | ICD-10-CM | POA: Diagnosis not present

## 2016-01-10 DIAGNOSIS — O09899 Supervision of other high risk pregnancies, unspecified trimester: Secondary | ICD-10-CM

## 2016-01-10 DIAGNOSIS — O34219 Maternal care for unspecified type scar from previous cesarean delivery: Secondary | ICD-10-CM | POA: Diagnosis present

## 2016-01-10 DIAGNOSIS — Z87891 Personal history of nicotine dependence: Secondary | ICD-10-CM | POA: Diagnosis not present

## 2016-01-10 DIAGNOSIS — F17201 Nicotine dependence, unspecified, in remission: Secondary | ICD-10-CM

## 2016-01-10 DIAGNOSIS — D62 Acute posthemorrhagic anemia: Secondary | ICD-10-CM | POA: Diagnosis not present

## 2016-01-10 DIAGNOSIS — Z23 Encounter for immunization: Secondary | ICD-10-CM | POA: Diagnosis not present

## 2016-01-10 DIAGNOSIS — O0930 Supervision of pregnancy with insufficient antenatal care, unspecified trimester: Secondary | ICD-10-CM

## 2016-01-10 DIAGNOSIS — O09219 Supervision of pregnancy with history of pre-term labor, unspecified trimester: Secondary | ICD-10-CM

## 2016-01-10 LAB — BASIC METABOLIC PANEL
Anion gap: 8 (ref 5–15)
BUN: 17 mg/dL (ref 6–20)
CHLORIDE: 102 mmol/L (ref 101–111)
CO2: 24 mmol/L (ref 22–32)
Calcium: 8.8 mg/dL — ABNORMAL LOW (ref 8.9–10.3)
Creatinine, Ser: 0.75 mg/dL (ref 0.44–1.00)
GFR calc non Af Amer: >60 mL/min (ref >60)
Glucose, Bld: 83 mg/dL (ref 65–99)
POTASSIUM: 3.8 mmol/L (ref 3.5–5.1)
SODIUM: 134 mmol/L — AB (ref 135–145)

## 2016-01-10 LAB — CBC
HCT: 32.6 % — ABNORMAL LOW (ref 35.0–47.0)
HEMOGLOBIN: 10.8 g/dL — AB (ref 12.0–16.0)
MCH: 26.9 pg (ref 26.0–34.0)
MCHC: 33 g/dL (ref 32.0–36.0)
MCV: 81.6 fL (ref 80.0–100.0)
Platelets: 234 10*3/uL (ref 150–440)
RBC: 3.99 MIL/uL (ref 3.80–5.20)
RDW: 15.5 % — ABNORMAL HIGH (ref 11.5–14.5)
WBC: 11.1 10*3/uL — ABNORMAL HIGH (ref 3.6–11.0)

## 2016-01-10 LAB — URINE DRUG SCREEN, QUALITATIVE (ARMC ONLY)
AMPHETAMINES, UR SCREEN: NOT DETECTED
Barbiturates, Ur Screen: NOT DETECTED
Benzodiazepine, Ur Scrn: NOT DETECTED
COCAINE METABOLITE, UR ~~LOC~~: POSITIVE — AB
Cannabinoid 50 Ng, Ur ~~LOC~~: NOT DETECTED
MDMA (ECSTASY) UR SCREEN: NOT DETECTED
METHADONE SCREEN, URINE: NOT DETECTED
Opiate, Ur Screen: NOT DETECTED
Phencyclidine (PCP) Ur S: NOT DETECTED
TRICYCLIC, UR SCREEN: NOT DETECTED

## 2016-01-10 LAB — RAPID HIV SCREEN (HIV 1/2 AB+AG)
HIV 1/2 Antibodies: NONREACTIVE
HIV-1 P24 Antigen - HIV24: NONREACTIVE

## 2016-01-10 LAB — URINALYSIS COMPLETE WITH MICROSCOPIC (ARMC ONLY)
Bilirubin Urine: NEGATIVE
Glucose, UA: NEGATIVE mg/dL
KETONES UR: NEGATIVE mg/dL
NITRITE: NEGATIVE
PH: 6 (ref 5.0–8.0)
PROTEIN: 100 mg/dL — AB
SPECIFIC GRAVITY, URINE: 1.026 (ref 1.005–1.030)

## 2016-01-10 LAB — CHLAMYDIA/NGC RT PCR (ARMC ONLY)
Chlamydia Tr: NOT DETECTED
N gonorrhoeae: NOT DETECTED

## 2016-01-10 LAB — TYPE AND SCREEN
ABO/RH(D): A POS
Antibody Screen: NEGATIVE

## 2016-01-10 LAB — ABO/RH: ABO/RH(D): A POS

## 2016-01-10 SURGERY — Surgical Case
Anesthesia: Spinal | Site: Abdomen | Wound class: Clean Contaminated

## 2016-01-10 MED ORDER — ONDANSETRON HCL 4 MG/2ML IJ SOLN: 4.0000 mg | Freq: Once | INTRAMUSCULAR | Status: DC | PRN

## 2016-01-10 MED ORDER — SODIUM CHLORIDE 0.9% FLUSH: 3.0000 mL | INTRAVENOUS | Status: DC | PRN

## 2016-01-10 MED ORDER — COCONUT OIL OIL: 1.0000 "application " | TOPICAL_OIL | Status: DC | PRN

## 2016-01-10 MED ORDER — MEPERIDINE HCL 25 MG/ML IJ SOLN: 6.2500 mg | INTRAMUSCULAR | Status: DC | PRN

## 2016-01-10 MED ORDER — NALBUPHINE HCL 10 MG/ML IJ SOLN: 5.0000 mg | Freq: Once | INTRAMUSCULAR | Status: DC | PRN

## 2016-01-10 MED ORDER — LACTATED RINGERS IV SOLN
500.0000 mL | INTRAVENOUS | Status: DC | PRN
  Administered 2016-01-10: 06:00:00 via INTRAVENOUS

## 2016-01-10 MED ORDER — NALBUPHINE HCL 10 MG/ML IJ SOLN: 5.0000 mg | INTRAMUSCULAR | Status: DC | PRN

## 2016-01-10 MED ORDER — DIPHENHYDRAMINE HCL 25 MG PO CAPS: 25.0000 mg | ORAL_CAPSULE | Freq: Four times a day (QID) | ORAL | Status: DC | PRN

## 2016-01-10 MED ORDER — ACETAMINOPHEN 325 MG PO TABS: 650.0000 mg | ORAL_TABLET | ORAL | Status: DC | PRN

## 2016-01-10 MED ORDER — BETAMETHASONE SOD PHOS & ACET 6 (3-3) MG/ML IJ SUSP
INTRAMUSCULAR | Status: AC
  Administered 2016-01-10: 6 mg
  Filled 2016-01-10: qty 1

## 2016-01-10 MED ORDER — LIDOCAINE 5 % EX PTCH
MEDICATED_PATCH | CUTANEOUS | Status: AC
  Filled 2016-01-10: qty 1

## 2016-01-10 MED ORDER — TERBUTALINE SULFATE 1 MG/ML IJ SOLN
INTRAMUSCULAR | Status: AC
  Administered 2016-01-10: 0.25 mg via SUBCUTANEOUS
  Filled 2016-01-10: qty 1

## 2016-01-10 MED ORDER — SIMETHICONE 80 MG PO CHEW: 80.0000 mg | CHEWABLE_TABLET | ORAL | Status: DC | PRN

## 2016-01-10 MED ORDER — TETANUS-DIPHTH-ACELL PERTUSSIS 5-2.5-18.5 LF-MCG/0.5 IM SUSP
0.5000 mL | Freq: Once | INTRAMUSCULAR | Status: AC
  Administered 2016-01-13: 0.5 mL via INTRAMUSCULAR
  Filled 2016-01-10: qty 0.5

## 2016-01-10 MED ORDER — BUPIVACAINE IN DEXTROSE 0.75-8.25 % IT SOLN
INTRATHECAL | Status: DC | PRN
  Administered 2016-01-10: 1.6 mL via INTRATHECAL

## 2016-01-10 MED ORDER — ONDANSETRON HCL 4 MG/2ML IJ SOLN: 4.0000 mg | Freq: Three times a day (TID) | INTRAMUSCULAR | Status: DC | PRN

## 2016-01-10 MED ORDER — LACTATED RINGERS IV SOLN
INTRAVENOUS | Status: DC
  Administered 2016-01-11: 06:00:00 via INTRAVENOUS

## 2016-01-10 MED ORDER — MAGNESIUM HYDROXIDE 400 MG/5ML PO SUSP: 30.0000 mL | ORAL | Status: DC | PRN

## 2016-01-10 MED ORDER — LIDOCAINE HCL (PF) 1 % IJ SOLN: 30.0000 mL | INTRAMUSCULAR | Status: DC | PRN

## 2016-01-10 MED ORDER — NALOXONE HCL 0.4 MG/ML IJ SOLN: 0.4000 mg | INTRAMUSCULAR | Status: DC | PRN

## 2016-01-10 MED ORDER — MORPHINE SULFATE (PF) 0.5 MG/ML IJ SOLN
INTRAMUSCULAR | Status: DC | PRN
  Administered 2016-01-10: .2 mg via EPIDURAL

## 2016-01-10 MED ORDER — SOD CITRATE-CITRIC ACID 500-334 MG/5ML PO SOLN
30.0000 mL | ORAL | Status: AC
  Administered 2016-01-10: 30 mL via ORAL

## 2016-01-10 MED ORDER — KETOROLAC TROMETHAMINE 30 MG/ML IJ SOLN
30.0000 mg | Freq: Four times a day (QID) | INTRAMUSCULAR | Status: DC | PRN
Start: 2016-01-10 — End: 2016-01-10

## 2016-01-10 MED ORDER — BUTORPHANOL TARTRATE 1 MG/ML IJ SOLN
1.0000 mg | INTRAMUSCULAR | Status: DC | PRN
  Administered 2016-01-10: 1 mg via INTRAVENOUS
  Filled 2016-01-10: qty 1

## 2016-01-10 MED ORDER — MEASLES, MUMPS & RUBELLA VAC ~~LOC~~ INJ
0.5000 mL | INJECTION | Freq: Once | SUBCUTANEOUS | Status: DC
  Filled 2016-01-10: qty 0.5

## 2016-01-10 MED ORDER — NALOXONE HCL 2 MG/2ML IJ SOSY
1.0000 ug/kg/h | PREFILLED_SYRINGE | INTRAMUSCULAR | Status: DC | PRN
  Filled 2016-01-10: qty 2

## 2016-01-10 MED ORDER — FERROUS SULFATE 325 (65 FE) MG PO TABS
325.0000 mg | ORAL_TABLET | Freq: Two times a day (BID) | ORAL | Status: DC
  Administered 2016-01-10: 325 mg via ORAL
  Filled 2016-01-10: qty 1

## 2016-01-10 MED ORDER — OXYCODONE-ACETAMINOPHEN 5-325 MG PO TABS: 1.0000 | ORAL_TABLET | ORAL | Status: DC | PRN

## 2016-01-10 MED ORDER — KETOROLAC TROMETHAMINE 30 MG/ML IJ SOLN
30.0000 mg | Freq: Four times a day (QID) | INTRAMUSCULAR | Status: AC | PRN
  Administered 2016-01-10 – 2016-01-11 (×5): 30 mg via INTRAVENOUS
  Filled 2016-01-10 (×5): qty 1

## 2016-01-10 MED ORDER — OXYCODONE-ACETAMINOPHEN 5-325 MG PO TABS: 2.0000 | ORAL_TABLET | ORAL | Status: DC | PRN

## 2016-01-10 MED ORDER — SCOPOLAMINE 1 MG/3DAYS TD PT72
1.0000 | MEDICATED_PATCH | Freq: Once | TRANSDERMAL | Status: AC
  Administered 2016-01-10: 1.5 mg via TRANSDERMAL
  Filled 2016-01-10: qty 1

## 2016-01-10 MED ORDER — ONDANSETRON HCL 4 MG/2ML IJ SOLN
INTRAMUSCULAR | Status: AC
  Filled 2016-01-10: qty 2

## 2016-01-10 MED ORDER — DIBUCAINE 1 % RE OINT: 1.0000 "application " | TOPICAL_OINTMENT | RECTAL | Status: DC | PRN

## 2016-01-10 MED ORDER — PRENATAL MULTIVITAMIN CH: 1.0000 | ORAL_TABLET | Freq: Every day | ORAL | Status: DC

## 2016-01-10 MED ORDER — OXYTOCIN 40 UNITS IN LACTATED RINGERS INFUSION - SIMPLE MED
2.5000 [IU]/h | INTRAVENOUS | Status: AC
  Administered 2016-01-10: 2.5 [IU]/h via INTRAVENOUS
  Filled 2016-01-10 (×2): qty 1000

## 2016-01-10 MED ORDER — SOD CITRATE-CITRIC ACID 500-334 MG/5ML PO SOLN
30.0000 mL | ORAL | Status: DC | PRN
  Filled 2016-01-10: qty 30

## 2016-01-10 MED ORDER — ONDANSETRON HCL 4 MG/2ML IJ SOLN: 4.0000 mg | Freq: Four times a day (QID) | INTRAMUSCULAR | Status: DC | PRN

## 2016-01-10 MED ORDER — HYDROMORPHONE HCL 2 MG PO TABS: 2.0000 mg | ORAL_TABLET | ORAL | Status: DC | PRN

## 2016-01-10 MED ORDER — ONDANSETRON HCL 4 MG/2ML IJ SOLN
4.0000 mg | Freq: Three times a day (TID) | INTRAMUSCULAR | Status: DC | PRN
  Administered 2016-01-10: 4 mg via INTRAVENOUS

## 2016-01-10 MED ORDER — METHYLERGONOVINE MALEATE 0.2 MG/ML IJ SOLN
0.2000 mg | INTRAMUSCULAR | Status: DC | PRN
  Filled 2016-01-10: qty 1

## 2016-01-10 MED ORDER — ZOLPIDEM TARTRATE 5 MG PO TABS: 5.0000 mg | ORAL_TABLET | Freq: Every evening | ORAL | Status: DC | PRN

## 2016-01-10 MED ORDER — FENTANYL CITRATE (PF) 100 MCG/2ML IJ SOLN
25.0000 ug | INTRAMUSCULAR | Status: DC | PRN
  Administered 2016-01-10: 50 ug via INTRAVENOUS

## 2016-01-10 MED ORDER — CEFAZOLIN SODIUM-DEXTROSE 2-4 GM/100ML-% IV SOLN
2.0000 g | INTRAVENOUS | Status: DC
  Filled 2016-01-10: qty 100

## 2016-01-10 MED ORDER — PHENYLEPHRINE HCL 10 MG/ML IJ SOLN
INTRAMUSCULAR | Status: DC | PRN
  Administered 2016-01-10 (×5): 100 ug via INTRAVENOUS

## 2016-01-10 MED ORDER — IBUPROFEN 600 MG PO TABS: 600.0000 mg | ORAL_TABLET | Freq: Four times a day (QID) | ORAL | Status: DC

## 2016-01-10 MED ORDER — NALOXONE HCL 2 MG/2ML IJ SOSY
1.0000 ug/kg/h | PREFILLED_SYRINGE | INTRAVENOUS | Status: DC | PRN
  Filled 2016-01-10: qty 2

## 2016-01-10 MED ORDER — LIDOCAINE 5 % EX PTCH
MEDICATED_PATCH | CUTANEOUS | Status: DC | PRN
  Administered 2016-01-10: 1 via TRANSDERMAL

## 2016-01-10 MED ORDER — KETOROLAC TROMETHAMINE 30 MG/ML IJ SOLN: 30.0000 mg | Freq: Four times a day (QID) | INTRAMUSCULAR | Status: AC | PRN

## 2016-01-10 MED ORDER — OXYTOCIN BOLUS FROM INFUSION: 500.0000 mL | INTRAVENOUS | Status: DC

## 2016-01-10 MED ORDER — KETOROLAC TROMETHAMINE 30 MG/ML IJ SOLN: 30.0000 mg | Freq: Four times a day (QID) | INTRAMUSCULAR | Status: DC | PRN

## 2016-01-10 MED ORDER — NALBUPHINE HCL 10 MG/ML IJ SOLN
5.0000 mg | INTRAMUSCULAR | Status: DC | PRN
  Administered 2016-01-10: 5 mg via INTRAVENOUS
  Filled 2016-01-10: qty 1

## 2016-01-10 MED ORDER — TERBUTALINE SULFATE 1 MG/ML IJ SOLN
0.2500 mg | Freq: Once | INTRAMUSCULAR | Status: AC
  Administered 2016-01-10: 0.25 mg via SUBCUTANEOUS

## 2016-01-10 MED ORDER — METHYLERGONOVINE MALEATE 0.2 MG PO TABS: 0.2000 mg | ORAL_TABLET | ORAL | Status: DC | PRN

## 2016-01-10 MED ORDER — LACTATED RINGERS IV SOLN
INTRAVENOUS | Status: DC
  Administered 2016-01-10: 02:00:00 via INTRAVENOUS

## 2016-01-10 MED ORDER — OXYTOCIN 40 UNITS IN LACTATED RINGERS INFUSION - SIMPLE MED
2.5000 [IU]/h | INTRAVENOUS | Status: DC
  Administered 2016-01-10: 299 mL via INTRAVENOUS
  Administered 2016-01-10: 1 mL via INTRAVENOUS
  Filled 2016-01-10: qty 1000

## 2016-01-10 MED ORDER — SIMETHICONE 80 MG PO CHEW
80.0000 mg | CHEWABLE_TABLET | ORAL | Status: DC
  Administered 2016-01-10 – 2016-01-12 (×3): 80 mg via ORAL
  Filled 2016-01-10 (×3): qty 1

## 2016-01-10 MED ORDER — LACTATED RINGERS IV SOLN: INTRAVENOUS | Status: DC

## 2016-01-10 MED ORDER — ONDANSETRON HCL 4 MG/2ML IJ SOLN
INTRAMUSCULAR | Status: DC | PRN
  Administered 2016-01-10: 4 mg via INTRAVENOUS

## 2016-01-10 MED ORDER — SCOPOLAMINE 1 MG/3DAYS TD PT72: 1.0000 | MEDICATED_PATCH | Freq: Once | TRANSDERMAL | Status: DC

## 2016-01-10 MED ORDER — SENNOSIDES-DOCUSATE SODIUM 8.6-50 MG PO TABS
2.0000 | ORAL_TABLET | ORAL | Status: DC
  Filled 2016-01-10 (×3): qty 2

## 2016-01-10 MED ORDER — WITCH HAZEL-GLYCERIN EX PADS: 1.0000 "application " | MEDICATED_PAD | CUTANEOUS | Status: DC | PRN

## 2016-01-10 MED ORDER — LACTATED RINGERS IV BOLUS (SEPSIS): 1000.0000 mL | Freq: Once | INTRAVENOUS | Status: DC

## 2016-01-10 MED ORDER — MENTHOL 3 MG MT LOZG: 1.0000 | LOZENGE | OROMUCOSAL | Status: DC | PRN

## 2016-01-10 MED ORDER — MIDAZOLAM HCL 2 MG/2ML IJ SOLN
INTRAMUSCULAR | Status: DC | PRN
  Administered 2016-01-10 (×2): 1 mg via INTRAVENOUS

## 2016-01-10 SURGICAL SUPPLY — 27 items
BAG COUNTER SPONGE EZ (MISCELLANEOUS) ×2 IMPLANT
CANISTER SUCT 3000ML (MISCELLANEOUS) ×3 IMPLANT
CHLORAPREP W/TINT 26ML (MISCELLANEOUS) ×6 IMPLANT
CLOSURE WOUND 1/2 X4 (GAUZE/BANDAGES/DRESSINGS) ×1
COUNTER SPONGE BAG EZ (MISCELLANEOUS) ×1
DRSG TELFA 3X8 NADH (GAUZE/BANDAGES/DRESSINGS) IMPLANT
ELECT REM PT RETURN 9FT ADLT (ELECTROSURGICAL) ×3
ELECTRODE REM PT RTRN 9FT ADLT (ELECTROSURGICAL) ×1 IMPLANT
GAUZE SPONGE 4X4 12PLY STRL (GAUZE/BANDAGES/DRESSINGS) IMPLANT
GLOVE BIO SURGEON STRL SZ 6 (GLOVE) ×15 IMPLANT
GLOVE BIOGEL PI IND STRL 6.5 (GLOVE) ×3 IMPLANT
GLOVE BIOGEL PI INDICATOR 6.5 (GLOVE) ×6
GOWN STRL REUS W/ TWL LRG LVL3 (GOWN DISPOSABLE) ×3 IMPLANT
GOWN STRL REUS W/TWL LRG LVL3 (GOWN DISPOSABLE) ×6
HEMOSTAT ARISTA ABSORB 3G PWDR (MISCELLANEOUS) ×3 IMPLANT
KIT RM TURNOVER STRD PROC AR (KITS) ×3 IMPLANT
NS IRRIG 1000ML POUR BTL (IV SOLUTION) ×3 IMPLANT
PACK C SECTION AR (MISCELLANEOUS) ×3 IMPLANT
PAD OB MATERNITY 4.3X12.25 (PERSONAL CARE ITEMS) ×3 IMPLANT
PAD PREP 24X41 OB/GYN DISP (PERSONAL CARE ITEMS) IMPLANT
SPONGE LAP 18X18 5 PK (GAUZE/BANDAGES/DRESSINGS) ×3 IMPLANT
STRIP CLOSURE SKIN 1/2X4 (GAUZE/BANDAGES/DRESSINGS) ×2 IMPLANT
SUT MNCRL AB 4-0 PS2 18 (SUTURE) ×6 IMPLANT
SUT PLAIN 2 0 XLH (SUTURE) IMPLANT
SUT VIC AB 0 CT1 36 (SUTURE) ×12 IMPLANT
SUT VIC AB 3-0 SH 27 (SUTURE) ×2
SUT VIC AB 3-0 SH 27X BRD (SUTURE) ×1 IMPLANT

## 2016-01-10 NOTE — Op Note (Addendum)
Cesarean Section Procedure Note  Indications: previous uterine incision low vertical x 2  Pre-operative Diagnosis: 34 week 6 day pregnancy (by triage sono), no prenatal care, h/o prior preterm delivery x 2, h/o prior C-section x 2, active preterm labor with advanced cervical dilation.  Post-operative Diagnosis: same  Surgeon: [name] LaserAnika Leonel Mccollum, MD  Assistants: Surgical Scrub Tech  Procedure: Repeat low transverse Cesarean Section  Anesthesia: Spinal anesthesia  Procedure Details: The patient was seen in the Holding Room. The risks, benefits, complications, treatment options, and expected outcomes were discussed with the patient.  The patient concurred with the proposed plan, giving informed consent.  The site of surgery properly noted/marked. The patient was taken to the Operating Room, identified as [name] Odonnell and the procedure verified as C-Section Delivery. A Time Out was held and the above information confirmed.  After induction of anesthesia, the patient was draped and prepped in the usual sterile manner. Anesthesia was tested and noted to be adequate. A Pfannenstiel incision was made and carried down through the subcutaneous tissue to the fascia. Fascial incision was made and extended transversely. The fascia was separated from the underlying rectus tissue superiorly and inferiorly. The peritoneum was identified and entered. Peritoneal incision was extended longitudinally. The utero-vesical peritoneal reflection was incised transversely and the bladder flap was bluntly freed from the lower uterine segment. Adhesions of the bladder to the lower uterine segment were also lysed. A low transverse uterine incision was made. Delivered from cephalic presentation was a 2730 gram Female with Apgar scores of 9 at one minute and 9 at five minutes. The umbilical cord was clamped and cut, no cord blood was obtained for evaluation. The placenta was removed intact and appeared normal. The uterus was  exteriorized and cleared of all clots and debris. The uterus was exteriorized, and the uterine outline, tubes and ovaries appeared normal.  The uterine incision was closed with running locked sutures of 0-Vicryl.  A second suture of 0-Vicryl was used in an imbricating layer. Several figure of eight sutures were placed along the incision site to achieve hemostasis. The bladder flap was reapproximated with a 3-0 Vicryl in an interrupted fashion to achieve added hemostasis. Hemostasis was observed. The uterus was then returned to the abdomen. Lavage was carried out until clear.  Arista was placed over the incision site for added hemostasis. The peritoneum was reapproximated with 3-0 Vicryl. The fascia was then reapproximated with a running suture of 0-Vicryl. The skin was reapproximated with 4-0 Monocryl.  Instrument, sponge, and needle counts were correct prior the abdominal closure and at the conclusion of the case.   Findings: Female infant, cephalic presentation, 2730 grams, with Apgar scores of 9 at one minute and 9 at five minutes. Intact placenta with 3 vessel cord.  The uterine outline, tubes and ovaries appeared normal.  Adhesions of the bladder to the lower uterine segment, able to be sharply dissected without difficulty.  Estimated Blood Loss:  650 ml      Drains: foley catheter to gravity drainage, 50 clear urine at end of the procedure         Total IV Fluids:  960 ml  Specimens: None         Implants: None         Complications:  None; patient tolerated the procedure well.         Disposition: PACU - hemodynamically stable.         Condition: stable    [name] LaserAnika Hilary Pundt, MD [facility]

## 2016-01-10 NOTE — Progress Notes (Signed)
Father of baby came into patient room with her 28 yr old son and began to speak very loudly to the patient and her mother about the patient and her "attitude".  He and the patients mother began to shout at each other, she told him to leave, and he stated he was and that he was taking the 28 yr old with him.  The patient and her mother got extremely upset and told him he could not take the boy with him.  Security was called and put on standby, but the visitor decided to leave on his own.  Per request from the patient and her mother security was given the visitor's name and instructed that he is not allowed to visit her again while she is in the hospital.

## 2016-01-10 NOTE — Plan of Care (Signed)
Pericare completed with pt. [facility]. To room 351 with family at side and infant in open crib.  Pt oriented to room. Report to Ree ShayK Andrews RN [facility]>  C Broderic Bara RNC

## 2016-01-10 NOTE — Anesthesia Preprocedure Evaluation (Signed)
Anesthesia Evaluation  Patient identified by MRN, date of birth, ID band Patient awake    Reviewed: Allergy & Precautions, NPO status , Patient's Chart, lab work & pertinent test results, reviewed documented beta blocker date and time   Airway Mallampati: II  TM Distance: >3 FB     Dental  (+) Chipped   Pulmonary former smoker,           Cardiovascular      Neuro/Psych    GI/Hepatic   Endo/Other    Renal/GU      Musculoskeletal   Abdominal   Peds  Hematology   Anesthesia Other Findings   Reproductive/Obstetrics                             Anesthesia Physical Anesthesia Plan  ASA: II  Anesthesia Plan: Spinal and Epidural   Post-op Pain Management:    Induction:   Airway Management Planned:   Additional Equipment:   Intra-op Plan:   Post-operative Plan:   Informed Consent: I have reviewed the patients History and Physical, chart, labs and discussed the procedure including the risks, benefits and alternatives for the proposed anesthesia with the patient or authorized representative who has indicated his/her understanding and acceptance.     Plan Discussed with: CRNA  Anesthesia Plan Comments:         Anesthesia Quick Evaluation

## 2016-01-10 NOTE — Clinical Social Work Note (Addendum)
CSW consulted due to patient testing positive for cocaine on admission. Patient has a newborn whose drug screen is pending collection. CSW has noted nurses documentation regarding father of baby not being allowed to come and visit due to his behavior earlier this afternoon. Patient has a support system in the room which is her mother. CSW will await test results from her newborns and then complete assessment. York SpanielMonica Laketa Odonnell MSW,LCSW [phone]

## 2016-01-10 NOTE — H&P (Signed)
Obstetric History and Physical  [name] Odonnell is a 30 y.o. [phone] with IUP at 34.[redacted] weeks gestation (based on today's triage sono) with unknown LMP, EDD 02/15/2016 presenting for complaints of contractions and low back pain since midnight. Patient states she has been having  irregular, every 5-7 minutes contractions, minimal vaginal bleeding, intact membranes, with active fetal movement.    Prenatal Course Source of Care: no prenatal care this pregnancy Pregnancy complications or risks: Patient Active Problem List   Diagnosis Date Noted  . Insufficient prenatal care 01/10/2016  . H/O preterm delivery, currently pregnant 01/10/2016  . H/O cesarean section complicating pregnancy 01/10/2016  . Preterm labor 01/10/2016  . 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   Prenatal labs and studies: ABO, Rh:  pending Antibody:   pending Rubella:   pending RPR:   pending HBsAg:   pending HIV:   pending GBS:  unknown 1 hr Glucola:  not performed Genetic screening not performed (no PNC) Anatomy US not performed (no PNC)    History reviewed. No pertinent past medical history.  Past Surgical History  Procedure Laterality Date  . Cesarean section      x2    OB History  Gravida Para Term Preterm AB SAB TAB Ectopic Multiple Living  # Outcome Date GA Lbr Len/2nd Weight Sex Delivery Anes PTL Lv  4 Current           3 Term 10/05/14 [redacted]w[redacted]d   M CS-LVertical   Y  2 Preterm 07/10/13 [redacted]w[redacted]d   M CS-LVertical  Y Y     Complications: Breech presentation  1 Preterm 05/05/10 [redacted]w[redacted]d   M Vag-Spont  Y Y     Complications: Vasa previa      Social History   Social History  . Marital Status: Single    Spouse Name: N/A  . Number of Children: N/A  . Years of Education: N/A   Social History Main Topics  . Smoking status: Former Smoker    Quit date: 11/27/2015  . Smokeless tobacco: None  .  Alcohol Use: None  . Drug Use: No  . Sexual Activity: Yes   Other Topics Concern  . None   Social History Narrative  . None    History reviewed. No pertinent family history.  No prescriptions prior to admission    Allergies  Allergen Reactions  . Codeine Rash    Review of Systems: Negative except for what is mentioned in HPI.  Physical Exam: BP 122/88 mmHg  Pulse 81  Temp(Src) 97.7 F (36.5 C) (Oral)  Resp 19  Ht  (1.575 m)  Wt 136 lb (61.689 kg)  BMI 24.87 kg/m2 CONSTITUTIONAL: Well-developed, well-nourished female in moderate distress with contractions.  HENT:  Normocephalic, atraumatic, External right and left ear normal. Oropharynx is clear and moist EYES: Conjunctivae and EOM are normal. Pupils are equal, round, and reactive to light. No scleral icterus.  NECK: Normal range of motion, supple, no masses SKIN: Skin is warm and dry. No rash noted. Not diaphoretic. No erythema. No pallor. NEUROLGIC: Alert and oriented to person, place, and time. Normal reflexes, muscle tone coordination. No cranial nerve deficit noted. PSYCHIATRIC: Normal mood and affect. Normal behavior. Normal judgment and thought content. CARDIOVASCULAR: Normal heart rate noted, regular rhythm RESPIRATORY: Effort and breath sounds normal, no problems with respiration noted ABDOMEN: Soft, nontender,  nondistended, gravid. MUSCULOSKELETAL: Normal range of motion. No edema and no tenderness. 2+ distal pulses.  Cervical Exam: Dilatation 5 cm   Effacement 80%   Station 0   Presentation: cephalic FHT:  Baseline rate 140 bpm   Variability moderate  Accelerations present   Decelerations none Contractions: Every 5 mins   Pertinent Labs/Studies:   No results found for this or any previous visit (from the past 24 hour(s)).   Ultrasound (triage): Cephalic presentation, EGA 34.6 weeks, EDD 02/15/2016, anterior placenta, EFW 2361-2405 g (5 lb 3 0z - 5 lb 5 oz), AFI 8.2 cm. Sex  undeterminable.  Assessment : [name] Odonnell is a 30 y.o. [phone] at Unknown being active preterm labor at ~ 35 weeks with h/o prior preterm delivery x 2, h/o prior C-section x 2, no prenatal care.  Plan: 1. Labor: Discussed recommendation for repeat C-section as patient with h/o prior C-section x 2.  Patient is in agreement. The risks of cesarean section discussed with the patient included but were not limited to: bleeding which may require transfusion or reoperation; infection which may require antibiotics; injury to bowel, bladder, ureters or other surrounding organs; injury to the fetus; need for additional procedures including hysterectomy in the event of a life-threatening hemorrhage; placental abnormalities wth subsequent pregnancies, incisional problems, thromboembolic phenomenon and other postoperative/anesthesia complications. The patient concurred with the proposed plan, giving informed written consent for the procedure.   Patient has been NPO since 11 pm last night. will remain NPO for procedure. Anesthesia and OR aware. Preoperative prophylactic antibiotics and SCDs ordered on call to the OR.  To OR when ready. 2. Will order prenatal lab panel, alert Anesthesia. 3. FWB: Reassuring fetal heart tracing.  GBS unknown.  Will give 1 dose of terbutaline to attempt to arrest current labor.  Also will receive 1 dose of antenatal steroids.  To be seen by NICU team for discussion of fetal care due to preterm delivery.  4. Contraception - patient desires BTL, informed that as she has not had any PNC she has not allotted for appropriate time for counseling.  Offered interval BTL that could be scheduled at postpartum visit.  Both patient and patient's family note that she is not good with follow up. Declined Depo provera for same reasons (patient would likely not f/u after 1st dose). Discussed LARC contraception, can potentially place during current hospitalization, will inquire further.    [name] LaserAnika Nyemah Watton,  MD [facility]

## 2016-01-10 NOTE — Anesthesia Procedure Notes (Signed)
Spinal Patient location during procedure: OR Staffing Anesthesiologist: Berdine AddisonHOMAS, Kami Kube Performed by: anesthesiologist  Preanesthetic Checklist Completed: patient identified, site marked, surgical consent, pre-op evaluation, timeout performed, IV checked and risks and benefits discussed Spinal Block Patient position: sitting Prep: Betadine Patient monitoring: heart rate, cardiac monitor, continuous pulse ox and blood pressure Approach: midline Location: L3-4 Injection technique: single-shot Needle Needle type: Pencil-Tip  Needle gauge: 25 G Needle length: 9 cm Assessment Sensory level: T10 Additional Notes Marcaine 12mg  and 0.2 astromorph.

## 2016-01-10 NOTE — Plan of Care (Signed)
Report received from [facility]. Assumed care of pt. To OR for report from OR staff prior to recovering patient. Pt will return to LDR 2 for post op recovery. C Gatiher R[facility]

## 2016-01-10 NOTE — Transfer of Care (Signed)
Immediate Anesthesia Transfer of Care Note  Patient: [name] DoneSamantha Leclaire  Procedure(s) Performed: Procedure(s): CESAREAN SECTION (N/A)  Patient Location: PACU  Anesthesia Type:Spinal  Level of Consciousness: awake, alert  and oriented  Airway & Oxygen Therapy: Patient Spontanous Breathing  Post-op Assessment: Report given to RN and Post -op Vital signs reviewed and stable  Post vital signs: Reviewed and stable  Last Vitals:  Filed Vitals:   01/10/16 0447 01/10/16 0724  BP: 109/55 120/81  Pulse: 84 63  Temp:  36.4 C  Resp:  16    Last Pain:  Filed Vitals:   01/10/16 0725  PainSc: 9          Complications: No apparent anesthesia complications

## 2016-01-11 LAB — CBC
HEMATOCRIT: 24.6 % — AB (ref 35.0–47.0)
HEMOGLOBIN: 7.9 g/dL — AB (ref 12.0–16.0)
MCH: 26.2 pg (ref 26.0–34.0)
MCHC: 32.3 g/dL (ref 32.0–36.0)
MCV: 81.3 fL (ref 80.0–100.0)
PLATELETS: 204 10*3/uL (ref 150–440)
RBC: 3.03 MIL/uL — AB (ref 3.80–5.20)
RDW: 15.3 % — ABNORMAL HIGH (ref 11.5–14.5)
WBC: 23.9 10*3/uL — AB (ref 3.6–11.0)

## 2016-01-11 LAB — VARICELLA ZOSTER ANTIBODY, IGG: VARICELLA IGG: 1122 {index} (ref >165)

## 2016-01-11 LAB — RPR: RPR Ser Ql: NONREACTIVE

## 2016-01-11 LAB — RUBELLA SCREEN: RUBELLA: 3.62 {index} (ref >0.99)

## 2016-01-11 LAB — HEPATITIS B SURFACE ANTIGEN: HEP B S AG: NEGATIVE

## 2016-01-11 MED ORDER — FERROUS SULFATE 220 (44 FE) MG/5ML PO ELIX
88.0000 mg | ORAL_SOLUTION | Freq: Three times a day (TID) | ORAL | Status: DC
  Administered 2016-01-11 – 2016-01-13 (×8): 88 mg via ORAL
  Filled 2016-01-11 (×9): qty 2

## 2016-01-11 MED ORDER — OXYCODONE HCL 5 MG/5ML PO SOLN
5.0000 mg | ORAL | Status: DC | PRN
  Administered 2016-01-11 – 2016-01-13 (×11): 5 mg via ORAL
  Filled 2016-01-11 (×11): qty 5

## 2016-01-11 MED ORDER — IBUPROFEN 100 MG/5ML PO SUSP
600.0000 mg | Freq: Three times a day (TID) | ORAL | Status: DC | PRN
  Administered 2016-01-11 – 2016-01-13 (×6): 600 mg via ORAL
  Filled 2016-01-11 (×9): qty 30

## 2016-01-11 MED ORDER — ACETAMINOPHEN 160 MG/5ML PO SOLN
650.0000 mg | ORAL | Status: DC | PRN
  Filled 2016-01-11: qty 20.3

## 2016-01-11 MED ORDER — FERROUS SULFATE 75 (15 FE) MG/ML PO SOLN
15.0000 mg | Freq: Three times a day (TID) | ORAL | Status: DC
  Filled 2016-01-11: qty 1

## 2016-01-11 MED ORDER — LIDOCAINE 5 % EX PTCH
1.0000 | MEDICATED_PATCH | CUTANEOUS | Status: DC
  Administered 2016-01-11 – 2016-01-12 (×2): 1 via TRANSDERMAL
  Filled 2016-01-11 (×2): qty 1

## 2016-01-11 NOTE — Progress Notes (Signed)
Postpartum Day # 1: Cesarean Delivery  Subjective: Patient reports mild incisional pain, tolerating PO and no problems voiding.    Objective: Vital signs in last 24 hours: Temp:  [97.9 F (36.6 C)-98.7 F (37.1 C)] 98.1 F (36.7 C) (06/02 0356) Pulse Rate:  [50-96] 73 (06/02 0356) Resp:  [12-20] 18 (06/02 0356) BP: (94-121)/(46-77) 94/46 mmHg (06/02 0356) SpO2:  [97 %-100 %] 97 % (06/02 0356)  Physical Exam:  General: alert and no distress Lungs: clear to auscultation bilaterally Breasts: normal appearance, no masses or tenderness Heart: regular rate and rhythm, S1, S2 normal, no murmur, click, rub or gallop Pelvis: Lochia appropriate, Uterine Fundus firm, Incision: healing well, no significant drainage, no dehiscence, no significant erythema Extremities: DVT Evaluation: Negative Homan's sign. No cords or calf tenderness. No significant calf/ankle edema.   Recent Labs  01/10/16 0250 01/11/16 0601  HGB 10.8* 7.9*  HCT 32.6* 24.6*    Assessment/Plan: Status post Cesarean section. Doing well postoperatively.  Circumcision desired, to be performed outpatinet.  Contraception LARC (undecided).  Will attempt to place while inpatient due to patient's h/o poor f/u.  Anemia postpartum, secondary to normal surgical blood loss.  Asymptomatic.  Will treat with iron.  UDS positive for cocaine, s/p social work consult.  Advance diet Continue PO pain management Discontinue IVF Continue current care.  Hildred LaserAnika Jazsmine Macari [facility]

## 2016-01-11 NOTE — Clinical Social Work Maternal (Signed)
  CLINICAL SOCIAL WORK MATERNAL/CHILD NOTE  Patient Details  Name: [name] Odonnell MRN: [id number] Date of Birth: 03/09/1986  Date:  01/11/2016  Clinical Social Worker Initiating Note:  Shela Leff MSW,LCSW Date/ Time Initiated:  01/11/16/0900     Child's Name:      Legal Guardian:  Mother   Need for Interpreter:  None   Date of Referral:  01/10/16     Reason for Referral:  Current Substance Use/Substance Use During Pregnancy    Referral Source:  Physician   Address:     Phone number:      Household Members:  Self, Minor Children, Significant Other   Natural Supports (not living in the home):  Immediate Family   Professional Supports: None   Employment: Unemployed   Type of Work:     Education:      Pensions consultant:  Self-Pay    Other Resources:      Cultural/Religious Considerations Which May Impact Care:  none  Strengths:  Compliance with medical plan , Ability to meet basic needs , Home prepared for child    Risk Factors/Current Problems:  Substance Use    Cognitive State:  Alert    Mood/Affect:  Calm    CSW Assessment: CSW has been consulted due to patient testing positive for cocaine and newborn testing positive for cocaine. CSW met with patient this morning and she stated that she has no idea or why she or her newborn tested positive for cocaine. Patient denies using cocaine or any other substance. Patient states she and her significant other will be in the home with their 3 other children ages 18, 52, and 15 months. Patient reports that her mother will also stay in the home temporarily to assist with caring for the children. Patient denies history of mental illness. Patient states she has all necessities for her newborn but needs more clothing. CSW made patient aware that DSS CPS report would need to be made due to her and baby testing positive for cocaine. Patient verbalized understanding.  CSW Plan/Description:  Child Protective Service Report      Shela Leff, LCSW 01/11/2016, 10:17 AM

## 2016-01-11 NOTE — Anesthesia Post-op Follow-up Note (Signed)
  Anesthesia Pain Follow-up Note  Patient: [name] Odonnell  Day #: 1  Date of Follow-up: 01/11/2016 Time: 7:50 AM  Last Vitals:  Filed Vitals:   01/10/16 2331 01/11/16 0356  BP: 96/61 94/46  Pulse: 96 73  Temp: 37 C 36.7 C  Resp: 18 18    Level of Consciousness: alert  Pain: none   Side Effects:None  Catheter Site Exam:clean  Plan: D/C from anesthesia care  Rica MastBachich,  Roizy Harold M

## 2016-01-11 NOTE — Clinical Social Work Note (Signed)
DSS CPS is at the hospital to see patient. DSS Caseworker is Rachel.  Mable Lashley MSW,LCSW [phone] 

## 2016-01-11 NOTE — Anesthesia Postprocedure Evaluation (Signed)
Anesthesia Post Note  Patient: [name] DoneSamantha Goltz  Procedure(s) Performed: Procedure(s) (LRB): CESAREAN SECTION (N/A)  Patient location during evaluation: Mother Baby Anesthesia Type: Spinal Level of consciousness: awake and alert Pain management: pain level controlled Vital Signs Assessment: post-procedure vital signs reviewed and stable Respiratory status: spontaneous breathing Cardiovascular status: stable Postop Assessment: no headache Anesthetic complications: no    Last Vitals:  Filed Vitals:   01/10/16 2331 01/11/16 0356  BP: 96/61 94/46  Pulse: 96 73  Temp: 37 C 36.7 C  Resp: 18 18    Last Pain:  Filed Vitals:   01/11/16 0403  PainSc: 0-No pain                 Jameire Kouba,  Alessandra BevelsJennifer M

## 2016-01-12 LAB — CULTURE, BETA STREP (GROUP B ONLY)

## 2016-01-12 NOTE — Progress Notes (Signed)
Postpartum Day # 2: Cesarean Delivery  Subjective: Patient reports tolerating PO, + flatus and no problems voiding.  Denies nausea/vomiting.  Pain controlled with PO meds.    Objective: Blood pressure 110/82, pulse 84, temperature 98.2 F (36.8 C), temperature source Oral, resp. rate 18, height 5\' 2"  (1.575 m), weight 136 lb (61.689 kg), SpO2 99 %, unknown if currently breastfeeding.  Physical Exam:  General: alert and no distress Lungs: clear to auscultation bilaterally Breasts: normal appearance, no masses or tenderness Heart: regular rate and rhythm, S1, S2 normal, no murmur, click, rub or gallop Pelvis: Lochia appropriate, Uterine Fundus firm, Incision: healing well, no significant drainage, no dehiscence, no significant erythema Extremities: DVT Evaluation: Negative Homan's sign. No cords or calf tenderness. No significant calf/ankle edema.   Recent Labs  01/10/16 0250 01/11/16 0601  HGB 10.8* 7.9*  HCT 32.6* 24.6*    Assessment/Plan: Status post Cesarean section. Doing well postoperatively.  Contraception LARC (desires Nexplanon).  Will attempt to place while inpatient tomorrow due to patient's h/o poor f/u.  Anemia postpartum, secondary to normal surgical blood loss.  Asymptomatic.  Will treat with iron.  UDS positive for cocaine, s/p social work consult.  Regular diet Continue PO pain management Continue current care. Discharge home tomorrow.    Hildred LaserAnika Tymber Stallings [facility]

## 2016-01-13 DIAGNOSIS — Z30017 Encounter for initial prescription of implantable subdermal contraceptive: Secondary | ICD-10-CM

## 2016-01-13 MED ORDER — OXYCODONE HCL 5 MG/5ML PO SOLN: 5.0000 mg | ORAL | Status: DC | PRN

## 2016-01-13 MED ORDER — PRENATAL GUMMIES/DHA & FA 0.4-32.5 MG PO CHEW: 2.0000 | CHEWABLE_TABLET | Freq: Every day | ORAL | Status: DC

## 2016-01-13 MED ORDER — MAGNESIUM HYDROXIDE 400 MG/5ML PO SUSP: 30.0000 mL | ORAL | Status: DC | PRN

## 2016-01-13 MED ORDER — LIDOCAINE-EPINEPHRINE 1 %-1:100000 IJ SOLN
10.0000 mL | Freq: Once | INTRAMUSCULAR | Status: AC
  Administered 2016-01-13: 3 mL via INTRADERMAL
  Filled 2016-01-13: qty 10

## 2016-01-13 MED ORDER — SIMETHICONE 80 MG PO CHEW: 80.0000 mg | CHEWABLE_TABLET | Freq: Four times a day (QID) | ORAL | Status: DC | PRN

## 2016-01-13 MED ORDER — FERROUS SULFATE 220 (44 FE) MG/5ML PO ELIX: 88.0000 mg | ORAL_SOLUTION | Freq: Three times a day (TID) | ORAL | Status: DC

## 2016-01-13 MED ORDER — ACETAMINOPHEN 160 MG/5ML PO SOLN: 650.0000 mg | ORAL | Status: DC | PRN

## 2016-01-13 MED ORDER — IBUPROFEN 100 MG/5ML PO SUSP: 600.0000 mg | Freq: Three times a day (TID) | ORAL | Status: DC | PRN

## 2016-01-13 MED ORDER — ETONOGESTREL 68 MG ~~LOC~~ IMPL: 1.0000 | DRUG_IMPLANT | Freq: Once | SUBCUTANEOUS | Status: AC

## 2016-01-13 NOTE — Progress Notes (Signed)
  NEXPLANON INSERTION PROCEDURE NOTE  [name] Odonnell is a 30 y.o. [id number] for Nexplanon insertion. She is POD#3 s/p repeat C-section for preterm delivery, h/o prior C-section x 2, no prenatal care in last 2 pregnancies. Patient with h/o poor follow up.  No other gynecologic concerns.  Nexplanon Insertion Procedure Patient identified, informed consent performed, consent signed.   Patient does understand that irregular bleeding is a very common side effect of this medication. She was advised to have backup contraception for one week after placement. No pregnancy test performed as patient is is postpartum.  Appropriate time out taken.  Patient's left arm was prepped and draped in the usual sterile fashion. The ruler used to measure and mark insertion area.  Patient was prepped with betadine swab and then injected with 3 ml of 1% lidocaine.  Nexplanon removed from packaging,  Device confirmed in needle, then inserted full length of needle and withdrawn per handbook instructions. Nexplanon was able to palpated in the patient's arm; patient palpated the insert herself. There was minimal blood loss.  Patient insertion site covered with guaze and a pressure bandage to reduce any bruising.  The patient tolerated the procedure well and was given post procedure instructions.    Lot: [id number] Exp: 12/2017   [name] LaserAnika Algenis Ballin, MD [facility]

## 2016-01-13 NOTE — Discharge Summary (Signed)
Obstetric Discharge Summary Reason for Admission: onset of labor (preterm), history of prior C-section x 2, no prenatal care, h/o prior preterm delivery x 2 Prenatal Procedures: ultrasound Intrapartum Procedures: cesarean: low cervical, transverse Postpartum Procedures: Rubella Ig and Varivax, and Tdap.  Inserted Nexplanon on POD#3. Complications-Operative and Postpartum: none HEMOGLOBIN  Date Value Ref Range Status  01/11/2016 7.9* 12.0 - 16.0 g/dL Final   HGB  Date Value Ref Range Status  07/10/2013 10.9* 12.0-16.0 g/dL Final   HCT  Date Value Ref Range Status  01/11/2016 24.6* 35.0 - 47.0 % Final  07/11/2013 27.6* 35.0-47.0 % Final   Results for [name] Odonnell, [name] (MRN [id number]) as of 01/13/2016 12:50  Ref. Range 01/10/2016 03:00  Amphetamines, Ur Screen Latest Ref Range: NONE DETECTED  NONE DETECTED  Barbiturates, Ur Screen Latest Ref Range: NONE DETECTED  NONE DETECTED  Benzodiazepine, Ur Scrn Latest Ref Range: NONE DETECTED  NONE DETECTED  Cocaine Metabolite,Ur [place] Latest Ref Range: NONE DETECTED  POSITIVE (A)  Methadone Scn, Ur Latest Ref Range: NONE DETECTED  NONE DETECTED  MDMA (Ecstasy)Ur Screen Latest Ref Range: NONE DETECTED  NONE DETECTED  Cannabinoid 50 Ng, Ur [place] Latest Ref Range: NONE DETECTED  NONE DETECTED  Opiate, Ur Screen Latest Ref Range: NONE DETECTED  NONE DETECTED  Phencyclidine (PCP) Ur S Latest Ref Range: NONE DETECTED  NONE DETECTED  Tricyclic, Ur Screen Latest Ref Range: NONE DETECTED  NONE DETECTED   . Physical Exam:  General: alert and no distress Lochia: appropriate Uterine Fundus: firm Incision: healing well, no significant drainage, no dehiscence, no significant erythema DVT Evaluation: Negative Homan's sign. No cords or calf tenderness. No significant calf/ankle edema.  Discharge Diagnoses: Premature labor- delivered, history of prior C-section x 2, no prenatal care, h/o prior preterm delivery x 2   Discharge Information: Date:  01/13/2016 Activity: pelvic rest Diet: routine Medications: PNV, Ibuprofen, Iron and Roxicet, Gas-X Condition: stable Instructions: refer to practice specific booklet Discharge to: home Follow-up Information    Follow up with [name] LaserAnika Corliss Lamartina, MD In 6 weeks.   Specialties:  Obstetrics and Gynecology, Radiology   Why:  Postpartum visit   Contact information:   [address] [place] [id number] [phone]       Newborn Data: Live born female  Birth Weight: 6 lb 0.3 oz (2730 g) APGAR: 9, 9  Home with mother.  [name] Odonnell 01/13/2016, 1:00 PM

## 2016-01-13 NOTE — Progress Notes (Signed)
Postpartum Day # 3: Cesarean Delivery  Subjective: Patient reports tolerating PO, + flatus and no problems voiding.  Denies nausea/vomiting.  Pain controlled with PO meds.    Objective: Blood pressure 115/80, pulse 71, temperature 98 F (36.7 C), temperature source Oral, resp. rate 18, height 5\' 2"  (1.575 m), weight 136 lb (61.689 kg), SpO2 100 %, unknown if currently breastfeeding.   Physical Exam:  General: alert and no distress Lungs: clear to auscultation bilaterally Breasts: normal appearance, no masses or tenderness Heart: regular rate and rhythm, S1, S2 normal, no murmur, click, rub or gallop Pelvis: Lochia appropriate, Uterine Fundus firm, Incision: healing well, no significant drainage, no dehiscence, no significant erythema Extremities: DVT Evaluation: Negative Homan's sign. No cords or calf tenderness. No significant calf/ankle edema.   Recent Labs  01/11/16 0601  HGB 7.9*  HCT 24.6*    Assessment/Plan: Status post Cesarean section. Doing well postoperatively.  Contraception LARC (desires Nexplanon).  Will place today inpatient tomorrow due to patient's h/o poor f/u.  Anemia postpartum, secondary to normal surgical blood loss.  Asymptomatic.  Will treat with iron.  UDS positive for cocaine, s/p social work consult.  Regular diet Continue PO pain management Continue current care. Discharge home today.    Hildred LaserAnika Sky Primo [facility]

## 2016-01-13 NOTE — Discharge Instructions (Signed)
NEXPLANON PLACEMENT POST-PROCEDURE INSTRUCTIONS  1. You may take Ibuprofen, Aleve or Tylenol for pain if needed.  Pain should resolve within in 24 hours.  2. You may have intercourse after 24 hours.  If you using this for birth control, it is effective immediately.  3. You need to call if you have any fever, heavy bleeding, or redness at insertion site. Irregular bleeding is common the first several months after having a Nexplanonplaced. You do not need to call for this reason unless you are concerned.  4. Shower or bathe as normal.  You can remove the bandage after 24 hours.  Call your doctor for increased pain or vaginal bleeding, temperature above 100.4, depression, or concerns.  No strenuous activity or heavy lifting for 6 weeks.  No intercourse, tampons, douching, or enemas for 6 weeks.  No tub baths-showers only.  No driving for 2 weeks or while taking pain medications.  Continue prenatal vitamin and iron.  Keep incision clean and dry.  Call your doctor for incision concerns including redness, swelling, bleeding or drainage, or if begins to come apart.  Increase calories and fluids while breastfeeding.

## 2016-03-05 ENCOUNTER — Encounter: Payer: Self-pay | Admitting: Obstetrics and Gynecology

## 2016-03-05 ENCOUNTER — Ambulatory Visit (INDEPENDENT_AMBULATORY_CARE_PROVIDER_SITE_OTHER): Payer: Medicaid Other | Admitting: Obstetrics and Gynecology

## 2016-03-05 DIAGNOSIS — O99345 Other mental disorders complicating the puerperium: Secondary | ICD-10-CM

## 2016-03-05 DIAGNOSIS — F53 Postpartum depression: Secondary | ICD-10-CM

## 2016-03-05 DIAGNOSIS — O0930 Supervision of pregnancy with insufficient antenatal care, unspecified trimester: Secondary | ICD-10-CM

## 2016-03-05 DIAGNOSIS — F1410 Cocaine abuse, uncomplicated: Secondary | ICD-10-CM

## 2016-03-05 DIAGNOSIS — O9081 Anemia of the puerperium: Secondary | ICD-10-CM

## 2016-03-05 DIAGNOSIS — F1490 Cocaine use, unspecified, uncomplicated: Secondary | ICD-10-CM

## 2016-03-05 DIAGNOSIS — Z720 Tobacco use: Secondary | ICD-10-CM

## 2016-03-05 MED ORDER — CITALOPRAM HYDROBROMIDE 10 MG/5ML PO SOLN: 20.0000 mg | Freq: Every day | ORAL | 6 refills | Status: DC

## 2016-03-05 NOTE — Patient Instructions (Signed)
Pick up melatonin over the counter

## 2016-03-06 NOTE — Progress Notes (Signed)
   OBSTETRICS POSTPARTUM CLINIC PROGRESS NOTE  Subjective:     [name] Odonnell is a 30 y.o. [phone] female who presents for a postpartum visit. She is 7 weeks postpartum following a spontaneous vaginal delivery. I have fully reviewed the prenatal and intrapartum course. The pregnancy was complicated by onset of labor (preterm), history of prior C-section x 2, no prenatal care, h/o prior preterm delivery x 2, and + UDS (cocaine).  The delivery was at 34.6 gestational weeks.  Anesthesia: spinal. Postpartum course has been complicated by depressive symptoms (fatigue, anhedonia, decreased appetite, sadness). Baby's course has been well. Baby is feeding by formula. Bleeding: patient has/has not resumed menses, with Patient's last menstrual period was 03/02/2016.Marland Kitchen Bowel function is normal. Bladder function is normal. Patient is sexually active. Contraception method is Nexplanon (placed immediately postpartum while inpatient). Postpartum depression screening: positive (PHQ-9 score is 13).   The following portions of the patient's history were reviewed and updated as appropriate: allergies, current medications, past family history, past medical history, past social history, past surgical history and problem list.  Review of Systems Pertinent items noted in HPI and remainder of comprehensive ROS otherwise negative.   Objective:    BP 99/63 (BP Location: Left Arm, Patient Position: Sitting, Cuff Size: Normal)   Pulse 76   Ht 5\' 2"  (1.575 m)   Wt 121 lb 1.6 oz (54.9 kg)   LMP 03/02/2016   Breastfeeding? No   BMI 22.15 kg/m   General:  alert and no distress   Breasts:  inspection negative, no nipple discharge or bleeding, no masses or nodularity palpable  Lungs: clear to auscultation bilaterally  Heart:  regular rate and rhythm, S1, S2 normal, no murmur, click, rub or gallop  Abdomen: soft, non-tender; bowel sounds normal; no masses,  no organomegaly.  Well healed Pfannenstiel incision   Vulva:  normal   Vagina: normal vagina, no discharge, exudate, lesion, or erythema  Cervix:  no cervical motion tenderness and no lesions  Corpus: normal size, contour, position, consistency, mobility, non-tender  Adnexa:  normal adnexa and no mass, fullness, tenderness  Rectal Exam: Not performed.         Labs:  Lab Results  Component Value Date   HGB 7.9 (L) 01/11/2016    Assessment:   Routine postpartum exam s/p repeat C-section.    No prenatal care in pregnancy Postpartum depression H/o cocaine and tobacco use Anemia postpartum  Plan:   1. Contraception: Nexplanon in place 2. Will check Hgb for resolution of anemia.  3. Postpartum depression noted, offered psychotherapy with or without medication use. Patient declines psychotherapy and would like to begin an antidepressant. Initiated patient on Celexa.   4. Follow up in: 6 weeks for f/u of medications.   5. Continued to encuorage substance abuse and tobacco cessation   Hildred Laser, MD [facility]

## 2016-03-08 ENCOUNTER — Encounter: Payer: Self-pay | Admitting: Obstetrics and Gynecology

## 2016-03-08 DIAGNOSIS — F1490 Cocaine use, unspecified, uncomplicated: Secondary | ICD-10-CM | POA: Insufficient documentation

## 2016-03-08 DIAGNOSIS — O99345 Other mental disorders complicating the puerperium: Secondary | ICD-10-CM

## 2016-03-08 DIAGNOSIS — F53 Postpartum depression: Secondary | ICD-10-CM | POA: Insufficient documentation

## 2016-03-08 DIAGNOSIS — O9081 Anemia of the puerperium: Secondary | ICD-10-CM | POA: Insufficient documentation

## 2016-04-01 ENCOUNTER — Telehealth: Payer: Self-pay | Admitting: Obstetrics and Gynecology

## 2016-04-01 DIAGNOSIS — O99345 Other mental disorders complicating the puerperium: Principal | ICD-10-CM

## 2016-04-01 DIAGNOSIS — F53 Postpartum depression: Secondary | ICD-10-CM

## 2016-04-01 NOTE — Telephone Encounter (Signed)
I think the pharmacy sent a request on this pt to change the celexa to pill form instead of liquid. She called to see if we got it.

## 2016-04-02 ENCOUNTER — Telehealth: Payer: Self-pay | Admitting: Obstetrics and Gynecology

## 2016-04-02 MED ORDER — CITALOPRAM HYDROBROMIDE 10 MG PO TABS: 10.0000 mg | ORAL_TABLET | Freq: Every day | ORAL | 11 refills | Status: AC

## 2016-04-02 NOTE — Telephone Encounter (Signed)
I sent a note to ask for pill sintead of suspension of celexa for this pt. Her ins wont pay for the suspension anymore. She took her lasr dose yesterday

## 2016-04-02 NOTE — Addendum Note (Signed)
Addended by: Debbe BalesGLANTON, Kerianna Rawlinson C on: 04/02/2016 05:00 PM   Modules accepted: Orders

## 2016-04-02 NOTE — Telephone Encounter (Signed)
Called pt, she states she cannot afford the liquid form of celexa and now request tablet form. Will send in RX.

## 2016-04-03 NOTE — Telephone Encounter (Signed)
Spoke with this pt yesterday, Pill form sent in.

## 2016-04-17 ENCOUNTER — Ambulatory Visit: Payer: Medicaid Other | Admitting: Obstetrics and Gynecology

## 2024-06-19 ENCOUNTER — Emergency Department

## 2024-06-19 ENCOUNTER — Emergency Department: Admission: EM | Admit: 2024-06-19 | Discharge: 2024-06-19 | Disposition: A | Discharge status: Home / Self Care

## 2024-06-19 ENCOUNTER — Other Ambulatory Visit: Payer: Self-pay

## 2024-06-19 DIAGNOSIS — F17200 Nicotine dependence, unspecified, uncomplicated: Secondary | ICD-10-CM | POA: Diagnosis not present

## 2024-06-19 DIAGNOSIS — R079 Chest pain, unspecified: Secondary | ICD-10-CM

## 2024-06-19 DIAGNOSIS — R0789 Other chest pain: Secondary | ICD-10-CM | POA: Insufficient documentation

## 2024-06-19 DIAGNOSIS — R0602 Shortness of breath: Secondary | ICD-10-CM | POA: Diagnosis not present

## 2024-06-19 DIAGNOSIS — R7309 Other abnormal glucose: Secondary | ICD-10-CM | POA: Insufficient documentation

## 2024-06-19 LAB — URINALYSIS, ROUTINE W REFLEX MICROSCOPIC
Bilirubin Urine: NEGATIVE
Glucose, UA: NEGATIVE mg/dL
Ketones, ur: 5 mg/dL — AB
Leukocytes,Ua: NEGATIVE
Nitrite: NEGATIVE
Protein, ur: NEGATIVE mg/dL
Specific Gravity, Urine: 1.024 (ref 1.005–1.030)
pH: 5 (ref 5.0–8.0)

## 2024-06-19 LAB — CBC
HCT: 43.5 % (ref 36.0–46.0)
Hemoglobin: 14.4 g/dL (ref 12.0–15.0)
MCH: 30.6 pg (ref 26.0–34.0)
MCHC: 33.1 g/dL (ref 30.0–36.0)
MCV: 92.4 fL (ref 80.0–100.0)
Platelets: 257 K/uL (ref 150–400)
RBC: 4.71 MIL/uL (ref 3.87–5.11)
RDW: 12.4 % (ref 11.5–15.5)
WBC: 7.8 K/uL (ref 4.0–10.5)
nRBC: 0 % (ref 0.0–0.2)

## 2024-06-19 LAB — BASIC METABOLIC PANEL WITH GFR
Anion gap: 18 — ABNORMAL HIGH (ref 5–15)
BUN: 21 mg/dL — ABNORMAL HIGH (ref 6–20)
CO2: 20 mmol/L — ABNORMAL LOW (ref 22–32)
Calcium: 9.4 mg/dL (ref 8.9–10.3)
Chloride: 101 mmol/L (ref 98–111)
Creatinine, Ser: 1.15 mg/dL — ABNORMAL HIGH (ref 0.44–1.00)
GFR, Estimated: >60 mL/min (ref >60)
Glucose, Bld: 196 mg/dL — ABNORMAL HIGH (ref 70–99)
Potassium: 3.2 mmol/L — ABNORMAL LOW (ref 3.5–5.1)
Sodium: 139 mmol/L (ref 135–145)

## 2024-06-19 LAB — CBG MONITORING, ED: Glucose-Capillary: 85 mg/dL (ref 70–99)

## 2024-06-19 LAB — URINE DRUG SCREEN, QUALITATIVE (ARMC ONLY)
Amphetamines, Ur Screen: NOT DETECTED
Barbiturates, Ur Screen: NOT DETECTED
Benzodiazepine, Ur Scrn: NOT DETECTED
Cannabinoid 50 Ng, Ur ~~LOC~~: POSITIVE — AB
Cocaine Metabolite,Ur ~~LOC~~: NOT DETECTED
MDMA (Ecstasy)Ur Screen: NOT DETECTED
Methadone Scn, Ur: NOT DETECTED
Opiate, Ur Screen: NOT DETECTED
Phencyclidine (PCP) Ur S: NOT DETECTED
Tricyclic, Ur Screen: NOT DETECTED

## 2024-06-19 LAB — HEMOGLOBIN A1C
Hgb A1c MFr Bld: 4.9 % (ref 4.8–5.6)
Mean Plasma Glucose: 93.93 mg/dL

## 2024-06-19 LAB — PREGNANCY, URINE: Preg Test, Ur: NEGATIVE

## 2024-06-19 LAB — D-DIMER, QUANTITATIVE: D-Dimer, Quant: 0.48 ug{FEU}/mL (ref 0.00–0.50)

## 2024-06-19 LAB — TSH: TSH: 1.296 u[IU]/mL (ref 0.350–4.500)

## 2024-06-19 LAB — TROPONIN I (HIGH SENSITIVITY): Troponin I (High Sensitivity): <2 ng/L (ref <18)

## 2024-06-19 MED ORDER — POTASSIUM CHLORIDE 20 MEQ PO PACK
40.0000 meq | PACK | Freq: Once | ORAL | Status: AC
  Administered 2024-06-19: 40 meq via ORAL
  Filled 2024-06-19: qty 2

## 2024-06-19 MED ORDER — POTASSIUM CHLORIDE CRYS ER 20 MEQ PO TBCR: 40.0000 meq | EXTENDED_RELEASE_TABLET | Freq: Once | ORAL | Status: DC

## 2024-06-19 MED ORDER — SODIUM CHLORIDE 0.9 % IV BOLUS
1000.0000 mL | Freq: Once | INTRAVENOUS | Status: AC
  Administered 2024-06-19: 1000 mL via INTRAVENOUS

## 2024-06-19 NOTE — ED Triage Notes (Addendum)
 Pt comes with chest pain discomfort that started Friday night. Pt states some sob.   Pt states some might be her anxiety. Pt states she feels her heart is beating fast. Pt states no N/V. Pt stats hot flashes last night and felt sick.

## 2024-06-19 NOTE — Discharge Instructions (Addendum)
 Your blood work showed that you were a little bit dehydrated which is why you are given fluids and some potassium.  Please make sure you drink lots of water.  The rest of the lab work was reassuring.  Your chest x-ray and EKG were normal.  Your urinalysis did not show signs of infection.  Please reach out to one of the primary care providers attached to schedule follow-up appointment.  I have also placed a referral for cardiology.  Watch for phone call from them over the next few days, if you do not hear from them please call the office number attached.  You can take ibuprofen  or Tylenol  as needed for your chest pain.  Please return to the emergency department with any worsening symptoms.

## 2024-06-19 NOTE — ED Provider Notes (Signed)
 ----------------------------------------- 3:23 PM on 06/19/2024 -----------------------------------------  Blood pressure (!) 146/98, pulse (!) 119, temperature 97.8 F (36.6 C), resp. rate 18, height 5' 2 (1.575 m), weight 47.6 kg, last menstrual period 05/30/2024, SpO2 100%.  Assuming care from Devere Perry, PA-C.  In short, [name] Odonnell is a 38 y.o. female with a chief complaint of Chest Pain .  Refer to the original H&P for additional details.  The current plan of care is to wait for results of D dimer and get CTA if elevated and recheck CBG after fluid administration.  ____________________________________________    ED Results / Procedures / Treatments   Labs (all labs ordered are listed, but only abnormal results are displayed) Labs Reviewed  BASIC METABOLIC PANEL WITH GFR - Abnormal; Notable for the following components:      Result Value   Potassium 3.2 (*)    CO2 20 (*)    Glucose, Bld 196 (*)    BUN 21 (*)    Creatinine, Ser 1.15 (*)    Anion gap 18 (*)    All other components within normal limits  URINE DRUG SCREEN, QUALITATIVE ([facility] ONLY) - Abnormal; Notable for the following components:   Cannabinoid 50 Ng, Ur  POSITIVE (*)    All other components within normal limits  URINALYSIS, ROUTINE W REFLEX MICROSCOPIC - Abnormal; Notable for the following components:   Color, Urine YELLOW (*)    APPearance HAZY (*)    Hgb urine dipstick SMALL (*)    Ketones, ur 5 (*)    Bacteria, UA RARE (*)    All other components within normal limits  CBC  TSH  D-DIMER, QUANTITATIVE  PREGNANCY, URINE  HEMOGLOBIN A1C  POC URINE PREG, ED  CBG MONITORING, ED  TROPONIN I (HIGH SENSITIVITY)     EKG  ED provider interpretation: Sinus tachycardia.  Vent. rate 108 BPM PR interval 148 ms QRS duration 80 ms QT/QTcB 312/418 ms P-R-T axes 87 99 95   RADIOLOGY  I personally viewed and evaluated these images as part of my medical decision making, as well as reviewing the  written report by the radiologist.  ED Provider Interpretation: Negative for acute abnormalities.  DG Chest 2 View Result Date: 06/19/2024 EXAM: 2 VIEW(S) XRAY OF THE CHEST 06/19/2024 11:55:42 AM COMPARISON: None available. CLINICAL HISTORY: Chest pain FINDINGS: LUNGS AND PLEURA: No focal pulmonary opacity. No pulmonary edema. No pleural effusion. No pneumothorax. HEART AND MEDIASTINUM: No acute abnormality of the cardiac and mediastinal silhouettes. BONES AND SOFT TISSUES: No acute osseous abnormality. Mild dextrocurvature of thoracic spine. IMPRESSION: 1. No acute cardiopulmonary process. Electronically signed by: Waddell Calk MD 06/19/2024 12:19 PM EST RP Workstation: [id number]     PROCEDURES:  Critical Care performed: No  Procedures   MEDICATIONS ORDERED IN ED: Medications  sodium chloride  0.9 % bolus 1,000 mL (0 mLs Intravenous Stopped 06/19/24 1706)  potassium chloride (KLOR-CON) packet 40 mEq (40 mEq Oral Given 06/19/24 1454)     IMPRESSION / MDM / ASSESSMENT AND PLAN / ED COURSE  I reviewed the triage vital signs and the nursing notes.                             38 year old female presents for evaluation of chest pain.  Vital signs are stable at this point and patient is NAD on exam.  See previous provider's note for differential diagnosis.  Patient's presentation is most consistent with acute complicated illness / injury requiring  diagnostic workup.  Patient's D-dimer was negative so we did not need to get a CT a to evaluate for PE.  Her blood sugar had returned to normal after receiving fluids.  The rest of the workup is reassuring.  Do not feel that there is an emergent cause of patient's chest pain.  Suspect that her pain is most likely due to anxiety or musculoskeletal strain.  Did offer follow-up with cardiology which patient was interested in.  Will also provide follow-up information's for primary care.  Patient's diagnosis is consistent with nonspecific chest pain.  Patient is to follow up with PCP and cardiology as needed or otherwise directed. Patient is given ED precautions to return to the ED for any worsening or new symptoms.  Clinical Course as of 06/19/24 1706  Sun Jun 19, 2024  1522 D-dimer, quantitative Not elevated, will not proceed with CTA for PE. [LD]  1700 POC CBG, ED Blood sugar has returned to normal after the fluids. [LD]  1702 Urinalysis, Routine w reflex microscopic -Urine, Unspecified Source(!) Negative for presence of glucose, does show a few ketones, RBCs and WBCs with rare bacteria but do not feel that this is consistent with UTI [LD]  1702 TSH Within normal limits. [LD]  1702 Urine Drug Screen, Qualitative(!) Positive for cannabinoid otherwise negative. [LD]    Clinical Course User Index [LD] Cleaster Tinnie LABOR, PA-C    FINAL CLINICAL IMPRESSION(S) / ED DIAGNOSES   Final diagnoses:  Nonspecific chest pain     Rx / DC Orders   ED Discharge Orders          Ordered    Ambulatory referral to Cardiology       Comments: If you have not heard from the Cardiology office within the next 72 hours please call [phone].   06/19/24 1706             Note:  This document was prepared using Dragon voice recognition software and may include unintentional dictation errors.    Cleaster Tinnie LABOR, PA-C 06/19/24 1706    Nicholaus Rolland BRAVO, MD 06/19/24 2113

## 2024-06-19 NOTE — ED Provider Notes (Signed)
 [facility] Provider Note    Event Date/Time   First MD Initiated Contact with Patient 06/19/24 1404     (approximate)   History   Chest Pain   HPI  [name] Odonnell is a 38 y.o. female history of anemia, smoking, cocaine use presents emergency department with chest discomfort that started Friday night.  States feels like some pressure.  Did have an episode of shortness of breath.  Patient is a current smoker.  No recent travel, no radiation of pain, no sweating, no abdominal pain.  States that the light pressure like her hand is laying on her chest.  Does not increase with movement, activity etc.  States she does have a lot of anxiety and does not know if that is what is causing the problem.  Denies pain in her legs.  No history of DVTs      Physical Exam   Triage Vital Signs: ED Triage Vitals [06/19/24 1141]  Encounter Vitals Group     BP (!) 146/98     Girls Systolic BP Percentile      Girls Diastolic BP Percentile      Boys Systolic BP Percentile      Boys Diastolic BP Percentile      Pulse Rate (!) 119     Resp 18     Temp 97.8 F (36.6 C)     Temp src      SpO2 100 %     Weight 105 lb (47.6 kg)     Height 5' 2 (1.575 m)     Head Circumference      Peak Flow      Pain Score 4     Pain Loc      Pain Education      Exclude from Growth Chart     Most recent vital signs: Vitals:   06/19/24 1141  BP: (!) 146/98  Pulse: (!) 119  Resp: 18  Temp: 97.8 F (36.6 C)  SpO2: 100%     General: Awake, no distress.   CV:  Good peripheral perfusion.  Tachycardic Resp:  Normal effort. Lungs CTA Abd:  No distention.   Other:  Extremities nontender, patient very thin   ED Results / Procedures / Treatments   Labs (all labs ordered are listed, but only abnormal results are displayed) Labs Reviewed  BASIC METABOLIC PANEL WITH GFR - Abnormal; Notable for the following components:      Result Value   Potassium 3.2 (*)    CO2 20 (*)     Glucose, Bld 196 (*)    BUN 21 (*)    Creatinine, Ser 1.15 (*)    Anion gap 18 (*)    All other components within normal limits  CBC  HEMOGLOBIN A1C  TSH  D-DIMER, QUANTITATIVE  URINE DRUG SCREEN, QUALITATIVE ([facility] ONLY)  URINALYSIS, ROUTINE W REFLEX MICROSCOPIC  PREGNANCY, URINE  POC URINE PREG, ED  TROPONIN I (HIGH SENSITIVITY)     EKG  EKG   RADIOLOGY Chest x-ray    PROCEDURES:   Procedures  Critical Care:  no Chief Complaint  Patient presents with   Chest Pain      MEDICATIONS ORDERED IN ED: Medications  sodium chloride  0.9 % bolus 1,000 mL (1,000 mLs Intravenous New Bag/Given 06/19/24 1455)  potassium chloride (KLOR-CON) packet 40 mEq (40 mEq Oral Given 06/19/24 1454)     IMPRESSION / MDM / ASSESSMENT AND PLAN / ED COURSE  I reviewed the triage vital  signs and the nursing notes.                              Differential diagnosis includes, but is not limited to, chest wall pain, MI, PE, CAP, anxiety, dehydration, electrolyte imbalance  Patient's presentation is most consistent with acute illness / injury with system symptoms.   Medications given: Normal saline 1 L IV, potassium 40 mEq p.o.  EKG, my interpretation is tachycardia, no STEMI, see physician read  Basic metabolic panel shows elevated glucose of 196, patient has only had yogurt today and has no history of diabetes and no recent weight loss, BUN and creatinine are also elevated indicating she might be slightly dehydrated with her anion gap also elevated 18.  Potassium is decreased at 3.2 so we will replenish this with oral potassium here in the ED.  Her CBC, troponin are both reassuring.  Will not do a second troponin as patient is continue to have chest pain since Friday.  Feels there is an elevation of the show by now.    Will also order a UA to determine if there is any infection or glucose in the urine indicating possible diabetes.  Due to the patient being very thin we will do TSH along  with a UDS, PERC score is 1, Wells criteria is 1.5, will go ahead and order D-dimer.  If D-dimer is elevated will do CTA for PE.  I did order normal saline 1 L IV, potassium 40 mEq p.o.  Will reassess patient's glucose with a CBG following the 1 L of normal saline.   Care transferred to Tinnie Hudson, PA-C at shift change.  Plan at this time is to evaluate D-dimer, if elevated CTA for PE.  I feel CBG after fluids to see about her glucose level.  FINAL CLINICAL IMPRESSION(S) / ED DIAGNOSES   Final diagnoses:  Nonspecific chest pain     Rx / DC Orders   ED Discharge Orders     None        Note:  This document was prepared using Dragon voice recognition software and may include unintentional dictation errors.    Gasper Devere ORN, PA-C 06/19/24 1501    Nicholaus Rolland BRAVO, MD 06/19/24 2112

## 2024-06-19 NOTE — ED Notes (Signed)
 See triage note  Presents with some chest pressure/discomfort  States she noticed this on Friday  Has been intermittent

## 2024-09-08 ENCOUNTER — Ambulatory Visit

## 2024-09-08 ENCOUNTER — Encounter: Payer: Self-pay | Admitting: Cardiology

## 2024-09-08 ENCOUNTER — Ambulatory Visit: Attending: Cardiology | Admitting: Cardiology

## 2024-09-08 VITALS — BP 100/64 | HR 65 | Ht 62.0 in | Wt 106.0 lb

## 2024-09-08 DIAGNOSIS — R002 Palpitations: Secondary | ICD-10-CM | POA: Diagnosis present

## 2024-09-08 DIAGNOSIS — F17200 Nicotine dependence, unspecified, uncomplicated: Secondary | ICD-10-CM | POA: Insufficient documentation

## 2024-09-08 DIAGNOSIS — R009 Unspecified abnormalities of heart beat: Secondary | ICD-10-CM | POA: Diagnosis present

## 2024-09-08 NOTE — Progress Notes (Signed)
 " Cardiology Office Note:    Date:  09/08/2024   ID:  [name] Odonnell, DOB 11-04-1985, MRN [id number]  PCP:  Pcp, No   [facility] Providers Cardiologist:  None     Referring MD: Hyacinth Honey, NP   Chief Complaint  Patient presents with   New Patient (Initial Visit)    New pt has been doing well with no complaints of chest pain, chest pressure or SOB, medication reviewed verbally with patient    History of Present Illness:    [name] Odonnell is a 39 y.o. female with a hx of smoking x 15 years, presenting with prominent heartbeat.  States having occasional prominent heartbeat over the past 3 months.  Symptoms occur 4 days a week, denies dizziness, palpitations, presyncope or syncope.  States cutting back on caffeine, drinks about half a cup of caffeinated soda every other day.  Symptoms have stayed the same despite cutting back.  States being adopted, does not know much about family history of heart disease.  History reviewed. No pertinent past medical history.  Past Surgical History:  Procedure Laterality Date   CESAREAN SECTION     x2   CESAREAN SECTION N/A 01/10/2016   Procedure: CESAREAN SECTION;  Surgeon: Archie Savers, MD;  Location: [facility] ORS;  Service: Obstetrics;  Laterality: N/A;    Current Medications: Active Medications[1]   Allergies:   Codeine   Social History   Socioeconomic History   Marital status: Single    Spouse name: Not on file   Number of children: Not on file   Years of education: Not on file   Highest education level: Not on file  Occupational History   Not on file  Tobacco Use   Smoking status: Every Day    Current packs/day: 0.00    Average packs/day: 0.5 packs/day for 5.0 years (2.5 ttl pk-yrs)    Types: Cigarettes    Start date: 11/27/2010    Last attempt to quit: 11/27/2015    Years since quitting: 8.7   Smokeless tobacco: Never  Vaping Use   Vaping status: Never Used  Substance and Sexual Activity   Alcohol use: Never    Drug use: Yes    Types: Marijuana   Sexual activity: Not Currently    Birth control/protection: Implant    Comment: Nexplanon    Other Topics Concern   Not on file  Social History Narrative   Not on file   Social Drivers of Health   Tobacco Use: High Risk (09/08/2024)   Patient History    Smoking Tobacco Use: Every Day    Smokeless Tobacco Use: Never    Passive Exposure: Not on file  Financial Resource Strain: Not on file  Food Insecurity: Not on file  Transportation Needs: Not on file  Physical Activity: Not on file  Stress: Not on file  Social Connections: Not on file  Depression (EYV7-0): Not on file  Alcohol Screen: Not on file  Housing: Not on file  Utilities: Not on file  Health Literacy: Not on file     Family History: The patient's family history includes Heart attack in her maternal grandmother; Multiple sclerosis in her father.  ROS:   Please see the history of present illness.     All other systems reviewed and are negative.  EKGs/Labs/Other Studies Reviewed:    The following studies were reviewed today:  EKG Interpretation Date/Time:  Thursday September 08 2024 09:22:46 EST Ventricular Rate:  66 PR Interval:  202 QRS Duration:  72  QT Interval:  388 QTC Calculation: 406 R Axis:   90  Text Interpretation: Normal sinus rhythm Rightward axis Confirmed by Darliss Rogue ([id number]) on 09/08/2024 9:41:55 AM    Recent Labs: 06/19/2024: BUN 21; Creatinine, Ser 1.15; Hemoglobin 14.4; Platelets 257; Potassium 3.2; Sodium 139; TSH 1.296  Recent Lipid Panel No results found for: CHOL, TRIG, HDL, CHOLHDL, VLDL, LDLCALC, LDLDIRECT   Risk Assessment/Calculations:             Physical Exam:    VS:  BP 100/64 (BP Location: Left Arm, Patient Position: Sitting)   Pulse 65   Ht 5' 2 (1.575 m)   Wt 106 lb (48.1 kg)   SpO2 99%   BMI 19.39 kg/m     Wt Readings from Last 3 Encounters:  09/08/24 106 lb (48.1 kg)  06/19/24 105 lb (47.6 kg)   03/05/16 121 lb 1.6 oz (54.9 kg)     GEN:  Well nourished, well developed in no acute distress HEENT: Normal NECK: No JVD; No carotid bruits CARDIAC: RRR, no murmurs, rubs, gallops RESPIRATORY:  Clear to auscultation without rales, wheezing or rhonchi  ABDOMEN: Soft, non-tender, non-distended MUSCULOSKELETAL:  No edema; No deformity  SKIN: Warm and dry NEUROLOGIC:  Alert and oriented x 3 PSYCHIATRIC:  Normal affect   ASSESSMENT:    1. Heart beat abnormality   2. Current smoker    PLAN:    In order of problems listed above:  Patient complaining of heartbeat abnormality, prominent heartbeat.  Symptoms suggest PACs, PVCs.  Less likely sustained arrhythmia.  Placed cardiac monitor x 2 weeks to evaluate any significant rhythm abnormalities. Current smoker, cessation advised.  Follow-up after cardiac testing     Medication Adjustments/Labs and Tests Ordered: Current medicines are reviewed at length with the patient today.  Concerns regarding medicines are outlined above.  Orders Placed This Encounter  Procedures   LONG TERM MONITOR (3-14 DAYS)   EKG 12-Lead   No orders of the defined types were placed in this encounter.   Patient Instructions  Medication Instructions:  Your physician recommends that you continue on your current medications as directed. Please refer to the Current Medication list given to you today.   *If you need a refill on your cardiac medications before your next appointment, please call your pharmacy*  Lab Work: No labs ordered today  If you have labs (blood work) drawn today and your tests are completely normal, you will receive your results only by: MyChart Message (if you have MyChart) OR A paper copy in the mail If you have any lab test that is abnormal or we need to change your treatment, we will call you to review the results.  Testing/Procedures:  GEOFFRY HEWS- Long Term Monitor Instructions  Your physician has requested you wear a ZIO patch  monitor for 14 days.  This is a single patch monitor. [employer] supplies one patch monitor per enrollment. Additional stickers are not available. Please do not apply patch if you will be having a Nuclear Stress Test,  Echocardiogram, Cardiac CT, MRI, or Chest Xray during the period you would be wearing the  monitor. The patch cannot be worn during these tests. You cannot remove and re-apply the  ZIO XT patch monitor.  Your ZIO patch monitor will be mailed 3 day USPS to your address on file. It may take 3-5 days  to receive your monitor after you have been enrolled.  Once you have received your monitor, please review the enclosed instructions. Your monitor  has already been registered assigning a specific monitor serial # to you.  Billing and Patient Assistance Program Information  We have supplied [employer] with any of your insurance information on file for billing purposes. [employer] offers a sliding scale Patient Assistance Program for patients that do not have  insurance, or whose insurance does not completely cover the cost of the ZIO monitor.  You must apply for the Patient Assistance Program to qualify for this discounted rate.  To apply, please call [employer] at [phone], select option 4, select option 2, ask to apply for  Patient Assistance Program. Meredeth will ask your household income, and how many people  are in your household. They will quote your out-of-pocket cost based on that information.  [employer] will also be able to set up a 70-month, interest-free payment plan if needed.  Applying the monitor   Shave hair from upper left chest.  Hold abrader disc by orange tab. Rub abrader in 40 strokes over the upper left chest as  indicated in your monitor instructions.  Clean area with 4 enclosed alcohol pads. Let dry.  Apply patch as indicated in monitor instructions. Patch will be placed under collarbone on left  side of chest with arrow pointing upward.  Rub patch adhesive wings  for 2 minutes. Remove white label marked 1. Remove the white  label marked 2. Rub patch adhesive wings for 2 additional minutes.  While looking in a mirror, press and release button in center of patch. A small green light will  flash 3-4 times. This will be your only indicator that the monitor has been turned on.  Do not shower for the first 24 hours. You may shower after the first 24 hours.  Press the button if you feel a symptom. You will hear a small click. Record Date, Time and  Symptom in the Patient Logbook.  When you are ready to remove the patch, follow instructions on the last 2 pages of Patient  Logbook. Stick patch monitor onto the last page of Patient Logbook.  Place Patient Logbook in the blue and white box. Use locking tab on box and tape box closed  securely. The blue and white box has prepaid postage on it. Please place it in the mailbox as  soon as possible. Your physician should have your test results approximately 7 days after the  monitor has been mailed back to [facility].  Call [employer] Customer Care at [phone] if you have questions regarding  your ZIO XT patch monitor. Call them immediately if you see an orange light blinking on your  monitor.  If your monitor falls off in less than 4 days, contact our Monitor department at [phone].  If your monitor becomes loose or falls off after 4 days call [employer] at [phone] for  suggestions on securing your monitor   Follow-Up: At [facility], you and your health needs are our priority.  As part of our continuing mission to provide you with exceptional heart care, our providers are all part of one team.  This team includes your primary Cardiologist (physician) and Advanced Practice Providers or APPs (Physician Assistants and Nurse Practitioners) who all work together to provide you with the care you need, when you need it.  Your next appointment:   2 month(s)  Provider:   You may see Dr  Darliss or one of the following Advanced Practice Providers on your designated Care Team:   Lonni Meager, NP Lesley Maffucci, PA-C Bernardino Bring, PA-C Cadence Franchester,  PA-C Tylene Lunch, NP Barnie Hila, NP    We recommend signing up for the patient portal called MyChart.  Sign up information is provided on this After Visit Summary.  MyChart is used to connect with patients for Virtual Visits (Telemedicine).  Patients are able to view lab/test results, encounter notes, upcoming appointments, etc.  Non-urgent messages can be sent to your provider as well.   To learn more about what you can do with MyChart, go to [email or web address].              Signed, Redell Cave, MD  09/08/2024 10:56 AM    [facility]    [1]  Current Meds  Medication Sig   etonogestrel  (NEXPLANON ) 68 MG IMPL implant 1 each (68 mg total) by Subdermal route once.   "

## 2024-09-08 NOTE — Patient Instructions (Signed)
 Medication Instructions:  Your physician recommends that you continue on your current medications as directed. Please refer to the Current Medication list given to you today.   *If you need a refill on your cardiac medications before your next appointment, please call your pharmacy*  Lab Work: No labs ordered today  If you have labs (blood work) drawn today and your tests are completely normal, you will receive your results only by: MyChart Message (if you have MyChart) OR A paper copy in the mail If you have any lab test that is abnormal or we need to change your treatment, we will call you to review the results.  Testing/Procedures: [name] HEWS- Long Term Monitor Instructions  Your physician has requested you wear a ZIO patch monitor for 14 days.  This is a single patch monitor. [employer] supplies one patch monitor per enrollment. Additional stickers are not available. Please do not apply patch if you will be having a Nuclear Stress Test,  Echocardiogram, Cardiac CT, MRI, or Chest Xray during the period you would be wearing the  monitor. The patch cannot be worn during these tests. You cannot remove and re-apply the  ZIO XT patch monitor.  Your ZIO patch monitor will be mailed 3 day USPS to your address on file. It may take 3-5 days  to receive your monitor after you have been enrolled.  Once you have received your monitor, please review the enclosed instructions. Your monitor  has already been registered assigning a specific monitor serial # to you.  Billing and Patient Assistance Program Information  We have supplied [employer] with any of your insurance information on file for billing purposes. [employer] offers a sliding scale Patient Assistance Program for patients that do not have  insurance, or whose insurance does not completely cover the cost of the ZIO monitor.  You must apply for the Patient Assistance Program to qualify for this discounted rate.  To apply, please call [employer] at  [phone], select option 4, select option 2, ask to apply for  Patient Assistance Program. Meredeth will ask your household income, and how many people  are in your household. They will quote your out-of-pocket cost based on that information.  [employer] will also be able to set up a 24-month, interest-free payment plan if needed.  Applying the monitor   Shave hair from upper left chest.  Hold abrader disc by orange tab. Rub abrader in 40 strokes over the upper left chest as  indicated in your monitor instructions.  Clean area with 4 enclosed alcohol pads. Let dry.  Apply patch as indicated in monitor instructions. Patch will be placed under collarbone on left  side of chest with arrow pointing upward.  Rub patch adhesive wings for 2 minutes. Remove white label marked 1. Remove the white  label marked 2. Rub patch adhesive wings for 2 additional minutes.  While looking in a mirror, press and release button in center of patch. A small green light will  flash 3-4 times. This will be your only indicator that the monitor has been turned on.  Do not shower for the first 24 hours. You may shower after the first 24 hours.  Press the button if you feel a symptom. You will hear a small click. Record Date, Time and  Symptom in the Patient Logbook.  When you are ready to remove the patch, follow instructions on the last 2 pages of Patient  Logbook. Stick patch monitor onto the last page of Patient Logbook.  Place  Patient Logbook in the blue and white box. Use locking tab on box and tape box closed  securely. The blue and white box has prepaid postage on it. Please place it in the mailbox as  soon as possible. Your physician should have your test results approximately 7 days after the  monitor has been mailed back to [facility].  Call [facility] Customer Care at [phone] if you have questions regarding  your ZIO XT patch monitor. Call them immediately if you see an orange light  blinking on your  monitor.  If your monitor falls off in less than 4 days, contact our Monitor department at [phone].  If your monitor becomes loose or falls off after 4 days call [employer] at [phone] for  suggestions on securing your monitor   Follow-Up: At [facility], you and your health needs are our priority.  As part of our continuing mission to provide you with exceptional heart care, our providers are all part of one team.  This team includes your primary Cardiologist (physician) and Advanced Practice Providers or APPs (Physician Assistants and Nurse Practitioners) who all work together to provide you with the care you need, when you need it.  Your next appointment:   2 month(s)  Provider:   You may see Dr Darliss or one of the following Advanced Practice Providers on your designated Care Team:   Lonni Meager, NP Lesley Maffucci, PA-C Bernardino Bring, PA-C Cadence [place]ahga, PA-C Tylene Lunch, NP Barnie Hila, NP    We recommend signing up for the patient portal called MyChart.  Sign up information is provided on this After Visit Summary.  MyChart is used to connect with patients for Virtual Visits (Telemedicine).  Patients are able to view lab/test results, encounter notes, upcoming appointments, etc.  Non-urgent messages can be sent to your provider as well.   To learn more about what you can do with MyChart, go to [email or web address].

## 2024-11-10 ENCOUNTER — Ambulatory Visit: Admitting: Cardiology
